# Patient Record
Sex: Male | Born: 1965 | Race: Black or African American | Hispanic: No | Marital: Married | Smoking: Never smoker
Health system: Southern US, Community
[De-identification: ages and names within clinical notes are randomized; demographics above are authoritative.]

## PROBLEM LIST (undated history)

## (undated) DIAGNOSIS — M25569 Pain in unspecified knee: Secondary | ICD-10-CM

## (undated) DIAGNOSIS — R209 Unspecified disturbances of skin sensation: Secondary | ICD-10-CM

## (undated) DIAGNOSIS — G4710 Hypersomnia, unspecified: Secondary | ICD-10-CM

## (undated) DIAGNOSIS — E7800 Pure hypercholesterolemia, unspecified: Secondary | ICD-10-CM

## (undated) DIAGNOSIS — E669 Obesity, unspecified: Secondary | ICD-10-CM

## (undated) DIAGNOSIS — Z833 Family history of diabetes mellitus: Secondary | ICD-10-CM

## (undated) HISTORY — DX: Pure hypercholesterolemia, unspecified: E78.00

## (undated) HISTORY — DX: Pain in unspecified knee: M25.569

## (undated) HISTORY — DX: Obesity, unspecified: E66.9

## (undated) HISTORY — DX: Family history of diabetes mellitus: Z83.3

## (undated) HISTORY — PX: HEMORRHOID SURGERY: SHX153

## (undated) HISTORY — DX: Unspecified disturbances of skin sensation: R20.9

## (undated) HISTORY — DX: Hypersomnia, unspecified: G47.10

---

## 2000-03-15 ENCOUNTER — Encounter: Admission: RE | Admit: 2000-03-15 | Discharge: 2000-03-15 | Payer: Self-pay | Admitting: Family Medicine

## 2000-03-15 ENCOUNTER — Encounter: Payer: Self-pay | Admitting: Family Medicine

## 2000-08-13 ENCOUNTER — Other Ambulatory Visit (HOSPITAL_COMMUNITY): Admission: RE | Admit: 2000-08-13 | Discharge: 2000-08-30 | Payer: Self-pay | Admitting: Psychiatry

## 2005-10-04 ENCOUNTER — Ambulatory Visit (HOSPITAL_COMMUNITY): Admission: RE | Admit: 2005-10-04 | Discharge: 2005-10-04 | Payer: Self-pay | Admitting: *Deleted

## 2008-10-01 ENCOUNTER — Encounter: Admission: RE | Admit: 2008-10-01 | Discharge: 2008-10-01 | Payer: Self-pay | Admitting: Internal Medicine

## 2008-10-16 ENCOUNTER — Encounter: Admission: RE | Admit: 2008-10-16 | Discharge: 2008-10-16 | Payer: Self-pay | Admitting: Internal Medicine

## 2008-12-26 ENCOUNTER — Emergency Department (HOSPITAL_COMMUNITY): Admission: EM | Admit: 2008-12-26 | Discharge: 2008-12-26 | Payer: Self-pay | Admitting: Family Medicine

## 2011-04-10 LAB — POCT RAPID STREP A (OFFICE): Streptococcus, Group A Screen (Direct): NEGATIVE

## 2011-05-12 NOTE — Op Note (Signed)
[name], ERRICO NO.:  [id number]   MEDICAL RECORD NO.:  [id number]          PATIENT TYPE:  AMB   LOCATION:  ENDO                         FACILITY:  [facility]   PHYSICIAN:  Georgiana Spinner, M.D.    DATE OF BIRTH:  July 20, 1966   DATE OF PROCEDURE:  10/04/2005  DATE OF DISCHARGE:                                 OPERATIVE REPORT   PROCEDURE:  Colonoscopy   INDICATIONS:  Rectal bleeding.   ANESTHESIA:  Demerol 60, Versed 7 mg.   DESCRIPTION OF PROCEDURE:  With the patient mildly sedated in the left  lateral decubitus position, we did a rectal examination and I could not  really feel the prostate well, but there were no external hemorrhoids.  Subsequently the Olympus videoscopic colonoscope was inserted in the rectum  and passed under direct vision to the cecum identified by ileocecal valve  and appendiceal orifice both of which were photographed. From this point,  the colonoscope was slowly withdrawn taking circumferential views of the  colonic mucosa stopping in the rectum which appeared normal on direct and  showed hemorrhoids on retroflexed view. The endoscope was straightened and  withdrawn. The patient's vital signs and pulse oximeter remained stable. The  patient tolerated the procedure well without apparent complications.   FINDINGS:  Internal hemorrhoids otherwise an unremarkable colonoscopic  examination to the cecum.   PLAN:  Have the patient follow-up with me on an as-needed basis.           ______________________________  Georgiana Spinner, M.D.     GMO/MEDQ  D:  10/04/2005  T:  10/04/2005  Job:  [id number]

## 2013-09-15 ENCOUNTER — Telehealth (HOSPITAL_COMMUNITY): Payer: Self-pay | Admitting: *Deleted

## 2013-09-19 ENCOUNTER — Telehealth (HOSPITAL_COMMUNITY): Payer: Self-pay | Admitting: *Deleted

## 2013-09-29 ENCOUNTER — Encounter (HOSPITAL_COMMUNITY): Payer: Self-pay | Admitting: *Deleted

## 2013-10-02 ENCOUNTER — Other Ambulatory Visit (HOSPITAL_COMMUNITY): Payer: Self-pay | Admitting: Internal Medicine

## 2013-10-02 DIAGNOSIS — Z8249 Family history of ischemic heart disease and other diseases of the circulatory system: Secondary | ICD-10-CM

## 2013-10-07 ENCOUNTER — Ambulatory Visit (HOSPITAL_COMMUNITY)
Admission: RE | Admit: 2013-10-07 | Discharge: 2013-10-07 | Disposition: A | Discharge status: Home / Self Care | Payer: Medicare HMO | Source: Ambulatory Visit | Attending: Cardiology | Admitting: Cardiology

## 2013-10-07 DIAGNOSIS — R079 Chest pain, unspecified: Secondary | ICD-10-CM

## 2013-10-07 DIAGNOSIS — Z8249 Family history of ischemic heart disease and other diseases of the circulatory system: Secondary | ICD-10-CM

## 2014-04-08 ENCOUNTER — Encounter: Payer: Self-pay | Admitting: Neurology

## 2014-04-09 ENCOUNTER — Ambulatory Visit: Payer: Medicare HMO | Admitting: Neurology

## 2014-10-26 ENCOUNTER — Ambulatory Visit: Payer: Medicare HMO | Admitting: Neurology

## 2014-10-28 ENCOUNTER — Encounter: Payer: Self-pay | Admitting: Neurology

## 2015-03-13 ENCOUNTER — Emergency Department (HOSPITAL_COMMUNITY)
Admission: EM | Admit: 2015-03-13 | Discharge: 2015-03-13 | Disposition: A | Discharge status: Home / Self Care | Payer: Managed Care, Other (non HMO) | Attending: Emergency Medicine | Admitting: Emergency Medicine

## 2015-03-13 ENCOUNTER — Encounter (HOSPITAL_COMMUNITY): Payer: Self-pay | Admitting: *Deleted

## 2015-03-13 DIAGNOSIS — F1410 Cocaine abuse, uncomplicated: Secondary | ICD-10-CM | POA: Diagnosis not present

## 2015-03-13 DIAGNOSIS — E780 Pure hypercholesterolemia: Secondary | ICD-10-CM | POA: Insufficient documentation

## 2015-03-13 DIAGNOSIS — F1010 Alcohol abuse, uncomplicated: Secondary | ICD-10-CM | POA: Diagnosis not present

## 2015-03-13 DIAGNOSIS — Z791 Long term (current) use of non-steroidal anti-inflammatories (NSAID): Secondary | ICD-10-CM | POA: Diagnosis not present

## 2015-03-13 DIAGNOSIS — Z79899 Other long term (current) drug therapy: Secondary | ICD-10-CM | POA: Insufficient documentation

## 2015-03-13 DIAGNOSIS — E669 Obesity, unspecified: Secondary | ICD-10-CM | POA: Diagnosis not present

## 2015-03-13 DIAGNOSIS — Z8669 Personal history of other diseases of the nervous system and sense organs: Secondary | ICD-10-CM | POA: Insufficient documentation

## 2015-03-13 DIAGNOSIS — F1910 Other psychoactive substance abuse, uncomplicated: Secondary | ICD-10-CM

## 2015-03-13 NOTE — ED Notes (Signed)
The pt is c/o wanting to be detoxed from crack cocaine and alcohol.  He last used both last pm.  He did not go to work today and he is afraid of loosing his job if he does not get help

## 2015-03-13 NOTE — Discharge Instructions (Signed)
Chemical Dependency Chemical dependency is an addiction to drugs or alcohol. It is characterized by the repeated behavior of seeking out and using drugs and alcohol despite harmful consequences to the health and safety of ones self and others.  RISK FACTORS There are certain situations or behaviors that increase a person's risk for chemical dependency. These include:  A family history of chemical dependency.  A history of mental health issues, including depression and anxiety.  A home environment where drugs and alcohol are easily available to you.  Drug or alcohol use at a young age. SYMPTOMS  The following symptoms can indicate chemical dependency:  Inability to limit the use of drugs or alcohol.  Nausea, sweating, shakiness, and anxiety that occurs when alcohol or drugs are not being used.  An increase in amount of drugs or alcohol that is necessary to get drunk or high. People who experience these symptoms can assess their use of drugs and alcohol by asking themselves the following questions:  Have you been told by friends or family that they are worried about your use of alcohol or drugs?  Do friends and family ever tell you about things you did while drinking alcohol or using drugs that you do not remember?  Do you lie about using alcohol or drugs or about the amounts you use?  Do you have difficulty completing daily tasks unless you use alcohol or drugs?  Is the level of your work or school performance lower because of your drug or alcohol use?  Do you get sick from using drugs or alcohol but keep using anyway?  Do you feel uncomfortable in social situations unless you use alcohol or drugs?  Do you use drugs or alcohol to help forget problems? An answer of yes to any of these questions may indicate chemical dependency. Professional evaluation is suggested. Document Released: 12/05/2001 Document Revised: 03/04/2012 Document Reviewed: 02/16/2011 [facility] Patient  Information 2015 [place], [place]. This information is not intended to replace advice given to you by your health care provider. Make sure you discuss any questions you have with your health care provider.  Alcohol and Nutrition Nutrition serves two purposes. It provides energy. It also maintains body structure and function. Food supplies energy. It also provides the building blocks needed to replace worn or damaged cells. Alcoholics often eat poorly. This limits their supply of essential nutrients. This affects energy supply and structure maintenance. Alcohol also affects the body's nutrients in:  Digestion.  Storage.  Using and getting rid of waste products. IMPAIRMENT OF NUTRIENT DIGESTION AND UTILIZATION   Once ingested, food must be broken down into small components (digested). Then it is available for energy. It helps maintain body structure and function. Digestion begins in the mouth. It continues in the stomach and intestines, with help from the pancreas. The nutrients from digested food are absorbed from the intestines into the blood. Then they are carried to the liver. The liver prepares nutrients for:  Immediate use.  Storage and future use.  Alcohol inhibits the breakdown of nutrients into usable molecules.  It decreases secretion of digestive enzymes from the pancreas.  Alcohol impairs nutrient absorption by damaging the cells lining the stomach and intestines.  It also interferes with moving some nutrients into the blood.  In addition, nutritional deficiencies themselves may lead to further absorption problems.  For example, folate deficiency changes the cells that line the small intestine. This impairs how water is absorbed. It also affects absorbed nutrients. These include glucose, sodium, and additional  folate.  Even if nutrients are digested and absorbed, alcohol can prevent them from being fully used. It changes their transport, storage, and excretion. Impaired  utilization of nutrients by alcoholics is indicated by:  Decreased liver stores of vitamins, such as vitamin A.  Increased excretion of nutrients such as fat. ALCOHOL AND ENERGY SUPPLY   Three basic nutritional components found in food are:  Carbohydrates.  Proteins.  Fats.  These are used as energy. Some alcoholics take in as much as 50% of their total daily calories from alcohol. They often neglect important foods.  Even when enough food is eaten, alcohol can impair the ways the body controls blood sugar (glucose) levels. It may either increase or decrease blood sugar.  In non-diabetic alcoholics, increased blood sugar (hyperglycemia) is caused by poor insulin secretion. It is usually temporary.  Decreased blood sugar (hypoglycemia) can cause serious injury even if this condition is short-lived. Low blood sugar can happen when a fasting or malnourished person drinks alcohol. When there is no food to supply energy, stored sugar is used up. The products of alcohol inhibit forming glucose from other compounds such as amino acids. As a result, alcohol causes the brain and other body tissue to lack glucose. It is needed for energy and function.  Alcohol is an energy source. But how the body processes and uses the energy from alcohol is complex. Also, when alcohol is substituted for carbohydrates, subjects tend to lose weight. This indicates that they get less energy from alcohol than from food. ALCOHOL - MAINTAINING CELL STRUCTURE AND FUNCTION  Structure Cells are made mostly of protein. So an adequate protein diet is important for maintaining cell structure. This is especially true if cells are being damaged. Research indicates that alcohol affects protein nutrition by causing impaired:  Digestion of proteins to amino acids.  Processing of amino acids by the small intestine and liver.  Synthesis of proteins from amino acids.  Protein secretion by the liver. Function Nutrients are  essential for the body to function well. They provide the tools that the body needs to work well:   Proteins.  Vitamins.  Minerals. Alcohol can disrupt body function. It may cause nutrient deficiencies. And it may interfere with the way nutrients are processed. Vitamins  Vitamins are essential to maintain growth and normal metabolism. They regulate many of the body`s processes. Chronic heavy drinking causes deficiencies in many vitamins. This is caused by eating less. And, in some cases, vitamins may be poorly absorbed. For example, alcohol inhibits fat absorption. It impairs how the vitamins A, E, and D are normally absorbed along with dietary fats. Not enough vitamin A may cause night blindness. Not enough vitamin D may cause softening of the bones.  Some alcoholics lack vitamins A, C, D, E, K, and the B vitamins. These are all involved in wound healing and cell maintenance. In particular, because vitamin K is necessary for blood clotting, lacking that vitamin can cause delayed clotting. The result is excess bleeding. Lacking other vitamins involved in brain function may cause severe neurological damage. Minerals Deficiencies of minerals such as calcium, magnesium, iron, and zinc are common in alcoholics. The alcohol itself does not seem to affect how these minerals are absorbed. Rather, they seem to occur secondary to other alcohol-related problems, such as:  Less calcium absorbed.  Not enough magnesium.  More urinary excretion.  Vomiting.  Diarrhea.  Not enough iron due to gastrointestinal bleeding.  Not enough zinc or losses related to other nutrient  deficiencies.  Mineral deficiencies can cause a variety of medical consequences. These range from calcium-related bone disease to zinc-related night blindness and skin lesions. ALCOHOL, MALNUTRITION, AND MEDICAL COMPLICATIONS  Liver Disease   Alcoholic liver damage is caused primarily by alcohol itself. But poor nutrition may  increase the risk of alcohol-related liver damage. For example, nutrients normally found in the liver are known to be affected by drinking alcohol. These include carotenoids, which are the major sources of vitamin A, and vitamin E compounds. Decreases in such nutrients may play some role in alcohol-related liver damage. Pancreatitis  Research suggests that malnutrition may increase the risk of developing alcoholic pancreatitis. Research suggests that a diet lacking in protein may increase alcohol's damaging effect on the pancreas. Brain  Nutritional deficiencies may have severe effects on brain function. These may be permanent. Specifically, thiamine deficiencies are often seen in alcoholics. They can cause severe neurological problems. These include:  Impaired movement.  Memory loss seen in Wernicke-Korsakoff syndrome. Pregnancy  Alcohol has toxic effects on fetal development. It causes alcohol-related birth defects. They include fetal alcohol syndrome. Alcohol itself is toxic to the fetus. Also, the nutritional deficiency can affect how the fetus develops. That may compound the risk of developmental damage.  Nutritional needs during pregnancy are 10% to 30% greater than normal. Food intake can increase by as much as 140% to cover the needs of both mother and fetus. An alcoholic mother`s nutritional problems may adversely affect the nutrition of the fetus. And alcohol itself can also restrict nutrition flow to the fetus. NUTRITIONAL STATUS OF ALCOHOLICS  Techniques for assessing nutritional status include:  Taking body measurements to estimate fat reserves. They include:  Weight.  Height.  Mass.  Skin fold thickness.  Performing blood analysis to provide measurements of circulating:  Proteins.  Vitamins.  Minerals.  These techniques tend to be imprecise. For many nutrients, there is no clear "cut-off" point that would allow an accurate definition of deficiency. So assessing the  nutritional status of alcoholics is limited by these techniques. Dietary status may provide information about the risk of developing nutritional problems. Dietary status is assessed by:  Taking patients' dietary histories.  Evaluating the amount and types of food they are eating.  It is difficult to determine what exact amount of alcohol begins to have damaging effects on nutrition. In general, moderate drinkers have 2 drinks or less per day. They seem to be at little risk for nutritional problems. Various medical disorders begin to appear at greater levels.  Research indicates that the majority of even the heaviest drinkers have few obvious nutritional deficiencies. Many alcoholics who are hospitalized for medical complications of their disease do have severe malnutrition. Alcoholics tend to eat poorly. Often they eat less than the amounts of food necessary to provide enough:  Carbohydrates.  Protein.  Fat.  Vitamins A and C.  B vitamins.  Minerals like calcium and iron. Of major concern is alcohol's effect on digesting food and use of nutrients. It may shift a mildly malnourished person toward severe malnutrition. Document Released: 10/05/2005 Document Revised: 03/04/2012 Document Reviewed: 03/21/2006 [facility] Patient Information 2015 [place], [place]. This information is not intended to replace advice given to you by your health care provider. Make sure you discuss any questions you have with your health care provider.

## 2015-03-13 NOTE — ED Provider Notes (Signed)
CSN: [id number]     Arrival date & time 03/13/15  1745 History   First MD Initiated Contact with Patient 03/13/15 1803     Chief Complaint  Patient presents with  . detox      (Consider location/radiation/quality/duration/timing/severity/associated sxs/prior Treatment) HPI   PCP: Anthony Leblanc,WALTER DAVIDSON, MD Blood pressure 117/70, pulse 87, temperature 99 F (37.2 C), temperature source Oral, resp. rate 18, SpO2 97 %.  Anthony Leblanc is a 49 y.o.male without any significant PMH presents to the ER requesting detox from cocaine and alcohol. He reports drinking since 1983, he binge drinks on the weekends and is able to stay sober during the week for work. He only uses cocaine after he has been drinking. He denies getting tremor or ever having seizures from alcohol withdrawals. The patient admits to drinking and using cocaine on Thursday night and missing work yesterday and today and is afraid he will loose his job unless he gets help. He reports having a wife and two kids are very supportive and helpful to his efforts to quite. He prefers to do outpatient treatment than inpatient treatment. He denies abuse of any other substances. Denies SI/HI. Denies hallucinations, delusions or paranoia.   Past Medical History  Diagnosis Date  . Pure hypercholesterolemia   . Obesity, unspecified   . Family history of diabetes mellitus   . Pain in joint, lower leg     Patella-femoral syndrome  . Disturbance of skin sensation   . Hypersomnia, unspecified    Past Surgical History  Procedure Laterality Date  . Hemorrhoid surgery     Family History  Problem Relation Age of Onset  . Cancer Father   . Diabetes Mother    History  Substance Use Topics  . Smoking status: Never Smoker   . Smokeless tobacco: Not on file  . Alcohol Use: 2.0 oz/week    4 drink(s) per week    Review of Systems  10 Systems reviewed and are negative for acute change except as noted in the HPI.     Allergies  Review of  patient's allergies indicates no known allergies.  Home Medications   Prior to Admission medications   Medication Sig Start Date End Date Taking? Authorizing Provider  atorvastatin (LIPITOR) 40 MG tablet Take 40 mg by mouth daily.   Yes Historical Provider, MD  celecoxib (CELEBREX) 200 MG capsule Take 200 mg by mouth daily. with food   Yes Historical Provider, MD  Multiple Vitamin (MULTIVITAMIN) tablet Take 1 tablet by mouth daily.   Yes Historical Provider, MD  omega-3 acid ethyl esters (LOVAZA) 1 G capsule Take 4 capsules by mouth daily.   Yes Historical Provider, MD   BP 117/70 mmHg  Pulse 87  Temp(Src) 99 F (37.2 C) (Oral)  Resp 18  SpO2 97% Physical Exam  Constitutional: He appears well-developed and well-nourished. No distress.  HENT:  Head: Normocephalic and atraumatic.  Eyes: Pupils are equal, round, and reactive to light.  Neck: Normal range of motion. Neck supple.  Cardiovascular: Normal rate and regular rhythm.   Pulmonary/Chest: Effort normal.  Abdominal: Soft.  Neurological: He is alert.  Skin: Skin is warm and dry.  Psychiatric: His speech is normal and behavior is normal. His mood appears not anxious. He is not actively hallucinating. Thought content is not delusional. He does not exhibit a depressed mood. He expresses no homicidal and no suicidal ideation.  Nursing note and vitals reviewed.   ED Course  Procedures (including critical care time) Labs  Review Labs Reviewed - No data to display  Imaging Review No results found.   EKG Interpretation None      MDM   Final diagnoses:  Polysubstance abuse    Patient binge drinks on alcohol and uses cocaine. He denies having tremors or seizures when no alcohol is in his system and reports being able to go for weeks without drinking when he is working out. He denies wanting inpatient treatment and has been through the [facility], feels he needs to be more present and apply himself, also potentially find  and AA sponsor. Denies SI/HI. I will give outpatient resources as well as clear return to ER guidelines.  49 y.o.Anthony Leblanc evaluation in the Emergency Department is complete. It has been determined that no acute conditions requiring further emergency intervention are present at this time. The patient/guardian have been advised of the diagnosis and plan. We have discussed signs and symptoms that warrant return to the ED, such as changes or worsening in symptoms.  Vital signs are stable at discharge. Filed Vitals:   03/13/15 1900  BP: 117/70  Pulse: 78  Temp:   Resp:     Patient/guardian has voiced understanding and agreed to follow-up with the PCP or specialist.    [name], PA-C 03/13/15 1911  Rolland Porter, MD 03/13/15 2212

## 2016-08-17 ENCOUNTER — Emergency Department (HOSPITAL_COMMUNITY)
Admission: EM | Admit: 2016-08-17 | Discharge: 2016-08-17 | Disposition: A | Discharge status: Home / Self Care | Payer: Managed Care, Other (non HMO) | Attending: Emergency Medicine | Admitting: Emergency Medicine

## 2016-08-17 ENCOUNTER — Encounter (HOSPITAL_COMMUNITY): Payer: Self-pay | Admitting: Emergency Medicine

## 2016-08-17 DIAGNOSIS — F1910 Other psychoactive substance abuse, uncomplicated: Secondary | ICD-10-CM

## 2016-08-17 DIAGNOSIS — R600 Localized edema: Secondary | ICD-10-CM | POA: Insufficient documentation

## 2016-08-17 DIAGNOSIS — Z79899 Other long term (current) drug therapy: Secondary | ICD-10-CM | POA: Insufficient documentation

## 2016-08-17 DIAGNOSIS — F1410 Cocaine abuse, uncomplicated: Secondary | ICD-10-CM | POA: Insufficient documentation

## 2016-08-17 LAB — CBC
HEMATOCRIT: 44.3 % (ref 39.0–52.0)
Hemoglobin: 14.4 g/dL (ref 13.0–17.0)
MCH: 28.1 pg (ref 26.0–34.0)
MCHC: 32.5 g/dL (ref 30.0–36.0)
MCV: 86.4 fL (ref 78.0–100.0)
PLATELETS: 271 10*3/uL (ref 150–400)
RBC: 5.13 MIL/uL (ref 4.22–5.81)
RDW: 13.5 % (ref 11.5–15.5)
WBC: 8.3 10*3/uL (ref 4.0–10.5)

## 2016-08-17 LAB — COMPREHENSIVE METABOLIC PANEL
ALT: 27 U/L (ref 17–63)
ANION GAP: 9 (ref 5–15)
AST: 27 U/L (ref 15–41)
Albumin: 4.3 g/dL (ref 3.5–5.0)
Alkaline Phosphatase: 80 U/L (ref 38–126)
BUN: 8 mg/dL (ref 6–20)
CHLORIDE: 105 mmol/L (ref 101–111)
CO2: 26 mmol/L (ref 22–32)
Calcium: 10 mg/dL (ref 8.9–10.3)
Creatinine, Ser: 1.22 mg/dL (ref 0.61–1.24)
GFR calc non Af Amer: >60 mL/min (ref >60)
Glucose, Bld: 136 mg/dL — ABNORMAL HIGH (ref 65–99)
Potassium: 4.2 mmol/L (ref 3.5–5.1)
SODIUM: 140 mmol/L (ref 135–145)
Total Bilirubin: 0.5 mg/dL (ref 0.3–1.2)
Total Protein: 8 g/dL (ref 6.5–8.1)

## 2016-08-17 LAB — RAPID URINE DRUG SCREEN, HOSP PERFORMED
AMPHETAMINES: NOT DETECTED
BARBITURATES: NOT DETECTED
BENZODIAZEPINES: NOT DETECTED
Cocaine: POSITIVE — AB
Opiates: NOT DETECTED
Tetrahydrocannabinol: NOT DETECTED

## 2016-08-17 LAB — ETHANOL: Alcohol, Ethyl (B): <5 mg/dL (ref <5)

## 2016-08-17 MED ORDER — ONDANSETRON HCL 4 MG/2ML IJ SOLN: 4.0000 mg | Freq: Once | INTRAMUSCULAR | Status: DC

## 2016-08-17 MED ORDER — SODIUM CHLORIDE 0.9 % IV BOLUS (SEPSIS)
1000.0000 mL | Freq: Once | INTRAVENOUS | Status: AC
  Administered 2016-08-17: 1000 mL via INTRAVENOUS

## 2016-08-17 NOTE — ED Triage Notes (Signed)
Patient states that he would like detox from crack.  Patient states that the last time he used was before coming to the ED.  Patient does have ETOH on board.

## 2016-08-17 NOTE — ED Provider Notes (Signed)
[facility]-EMERGENCY DEPT Provider Note   CSN: [id number] Arrival date & time: 08/17/16  0359     History   Chief Complaint Chief Complaint  Patient presents with  . Medical Clearance    HPI Anthony Leblanc is a 50 y.o. malePMH of alcohol and cocaine abuse, here with the same.  Patient states he last used around 11pm.  He wants help to quit.  He denies feeling like he is withdrawing currently.  He denies any SI/HI or hallucinations.  He denies medical complaints such as fever, resp complaints, vomiting or diarrhea.  He does have mild nausea.  There are no further complaints.  10 Systems reviewed and are negative for acute change except as noted in the HPI.   HPI  Past Medical History:  Diagnosis Date  . Disturbance of skin sensation   . Family history of diabetes mellitus   . Hypersomnia, unspecified   . Obesity, unspecified   . Pain in joint, lower leg    Patella-femoral syndrome  . Pure hypercholesterolemia     There are no active problems to display for this patient.   Past Surgical History:  Procedure Laterality Date  . HEMORRHOID SURGERY         Home Medications    Prior to Admission medications   Medication Sig Start Date End Date Taking? Authorizing Provider  Multiple Vitamin (MULTIVITAMIN) tablet Take 1 tablet by mouth daily.   Yes Historical Provider, MD  omega-3 acid ethyl esters (LOVAZA) 1 G capsule Take 4 capsules by mouth daily.   Yes Historical Provider, MD    Family History Family History  Problem Relation Age of Onset  . Cancer Father   . Diabetes Mother     Social History Social History  Substance Use Topics  . Smoking status: Never Smoker  . Smokeless tobacco: Never Used  . Alcohol use 2.0 oz/week    4 Standard drinks or equivalent per week     Allergies   Review of patient's allergies indicates no known allergies.   Review of Systems Review of Systems   Physical Exam Updated Vital Signs BP 143/93   Pulse 108   Temp 98.4 F  (36.9 C) (Oral)   Resp 15   Ht 6\' 2"  (1.88 m)   Wt 265 lb (120.2 kg)   SpO2 97%   BMI 34.02 kg/m   Physical Exam  Constitutional: He is oriented to person, place, and time. Vital signs are normal. He appears well-developed and well-nourished.  Non-toxic appearance. He does not appear ill. No distress.  HENT:  Head: Normocephalic and atraumatic.  Nose: Nose normal.  Mouth/Throat: Oropharynx is clear and moist. No oropharyngeal exudate.  Eyes: Conjunctivae and EOM are normal. Pupils are equal, round, and reactive to light. No scleral icterus.  Neck: Normal range of motion. Neck supple. No tracheal deviation, no edema, no erythema and normal range of motion present. No thyroid mass and no thyromegaly present.  Cardiovascular: Normal rate, regular rhythm, S1 normal, S2 normal, normal heart sounds, intact distal pulses and normal pulses.  Exam reveals no gallop and no friction rub.   No murmur heard. Pulmonary/Chest: Effort normal and breath sounds normal. No respiratory distress. He has no wheezes. He has no rhonchi. He has no rales.  Abdominal: Soft. Normal appearance and bowel sounds are normal. He exhibits no distension, no ascites and no mass. There is no hepatosplenomegaly. There is no tenderness. There is no rebound, no guarding and no CVA tenderness.  Musculoskeletal: Normal range  of motion. He exhibits edema. He exhibits no tenderness.  Lymphadenopathy:    He has no cervical adenopathy.  Neurological: He is alert and oriented to person, place, and time. He has normal strength. No cranial nerve deficit or sensory deficit.  Skin: Skin is warm, dry and intact. No petechiae and no rash noted. He is not diaphoretic. No erythema. No pallor.  Nursing note and vitals reviewed.    ED Treatments / Results  Labs (all labs ordered are listed, but only abnormal results are displayed) Labs Reviewed  URINE RAPID DRUG SCREEN, HOSP PERFORMED - Abnormal; Notable for the following:       Result  Value   Cocaine POSITIVE (*)    All other components within normal limits  COMPREHENSIVE METABOLIC PANEL - Abnormal; Notable for the following:    Glucose, Bld 136 (*)    All other components within normal limits  ETHANOL  CBC    EKG  EKG Interpretation  Date/Time:  Thursday August 17 2016 04:54:39 EDT Ventricular Rate:  100 PR Interval:    QRS Duration: 94 QT Interval:  354 QTC Calculation: 457 R Axis:   29 Text Interpretation:  Sinus tachycardia Abnormal R-wave progression, early transition No old tracing to compare Confirmed by Erroll Lunani, Masiyah Jorstad Ayokunle [phone]([id number]) on 08/17/2016 5:11:46 AM       Radiology No results found.  Procedures Procedures (including critical care time)  Medications Ordered in ED Medications  ondansetron (ZOFRAN) injection 4 mg (0 mg Intravenous Hold 08/17/16 0531)  sodium chloride 0.9 % bolus 1,000 mL (1,000 mLs Intravenous New Bag/Given 08/17/16 0531)     Initial Impression / Assessment and Plan / ED Course  I have reviewed the triage vital signs and the nursing notes.  Pertinent labs & imaging results that were available during my care of the patient were reviewed by me and considered in my medical decision making (see chart for details).  Clinical Course    Patient presents to the ED for detox.  He was informed we no longer consult TTS for detox.  He was given IVF for tachycardia and zofran for nausea, although this is likely related to cocaine use.  He was given resources to call upon DC.  Labs and UA pending.   5:47 AM labs unremarkable.  UDS pos for cocaine.  Resources given.  HR now down to 80s.  He appears well and in NAD.  Patient safe for Dc. Final Clinical Impressions(s) / ED Diagnoses   Final diagnoses:  None    New Prescriptions New Prescriptions   No medications on file     Tomasita CrumbleAdeleke Haely Leyland, MD 08/17/16 [phone]

## 2018-05-05 ENCOUNTER — Emergency Department (HOSPITAL_COMMUNITY)
Admission: EM | Admit: 2018-05-05 | Discharge: 2018-05-05 | Disposition: A | Discharge status: Home / Self Care | Payer: Managed Care, Other (non HMO) | Attending: Emergency Medicine | Admitting: Emergency Medicine

## 2018-05-05 ENCOUNTER — Other Ambulatory Visit: Payer: Self-pay

## 2018-05-05 ENCOUNTER — Emergency Department (HOSPITAL_COMMUNITY): Payer: Managed Care, Other (non HMO)

## 2018-05-05 DIAGNOSIS — W19XXXA Unspecified fall, initial encounter: Secondary | ICD-10-CM

## 2018-05-05 DIAGNOSIS — M25561 Pain in right knee: Secondary | ICD-10-CM

## 2018-05-05 DIAGNOSIS — S39012A Strain of muscle, fascia and tendon of lower back, initial encounter: Secondary | ICD-10-CM | POA: Insufficient documentation

## 2018-05-05 DIAGNOSIS — W1789XA Other fall from one level to another, initial encounter: Secondary | ICD-10-CM | POA: Insufficient documentation

## 2018-05-05 DIAGNOSIS — Y939 Activity, unspecified: Secondary | ICD-10-CM | POA: Insufficient documentation

## 2018-05-05 DIAGNOSIS — Y999 Unspecified external cause status: Secondary | ICD-10-CM | POA: Insufficient documentation

## 2018-05-05 DIAGNOSIS — Y929 Unspecified place or not applicable: Secondary | ICD-10-CM | POA: Insufficient documentation

## 2018-05-05 MED ORDER — DICLOFENAC SODIUM 50 MG PO TBEC: 50.0000 mg | DELAYED_RELEASE_TABLET | Freq: Two times a day (BID) | ORAL | 0 refills | Status: DC

## 2018-05-05 MED ORDER — CYCLOBENZAPRINE HCL 10 MG PO TABS: 10.0000 mg | ORAL_TABLET | Freq: Every evening | ORAL | 0 refills | Status: DC | PRN

## 2018-05-05 NOTE — ED Notes (Signed)
Bed: WTR6 Expected date:  Expected time:  Means of arrival:  Comments: 

## 2018-05-05 NOTE — ED Provider Notes (Signed)
[facility]-EMERGENCY DEPT Provider Note   CSN: [id number] Arrival date & time: 05/05/18  1706     History   Chief Complaint Chief Complaint  Patient presents with  . Knee Pain  . Back Pain  . Fall    HPI Anthony Leblanc is a 52 y.o. male who presents to the ED with knee pain and low back pain after jumping down off a truck with a bag of mulch and fell. The injury happened 3 days ago. Patient has been taking ibuprofen and it is helping. Patient missed work due to the injury and need a return note. Patient denies head injury or LOC. Patient is not on blood thinners.  HPI  Past Medical History:  Diagnosis Date  . Disturbance of skin sensation   . Family history of diabetes mellitus   . Hypersomnia, unspecified   . Obesity, unspecified   . Pain in joint, lower leg    Patella-femoral syndrome  . Pure hypercholesterolemia     There are no active problems to display for this patient.   Past Surgical History:  Procedure Laterality Date  . HEMORRHOID SURGERY          Home Medications    Prior to Admission medications   Medication Sig Start Date End Date Taking? Authorizing Provider  cyclobenzaprine (FLEXERIL) 10 MG tablet Take 1 tablet (10 mg total) by mouth at bedtime as needed for muscle spasms. 05/05/18   Janne Napoleon, NP  diclofenac (VOLTAREN) 50 MG EC tablet Take 1 tablet (50 mg total) by mouth 2 (two) times daily. 05/05/18   Janne Napoleon, NP  Multiple Vitamin (MULTIVITAMIN) tablet Take 1 tablet by mouth daily.    [provider]  omega-3 acid ethyl esters (LOVAZA) 1 G capsule Take 4 capsules by mouth daily.    [provider]    Family History Family History  Problem Relation Age of Onset  . Cancer Father   . Diabetes Mother     Social History Social History   Tobacco Use  . Smoking status: Never Smoker  . Smokeless tobacco: Never Used  Substance Use Topics  . Alcohol use: Yes    Alcohol/week: 2.0 oz    Types: 4  Standard drinks or equivalent per week  . Drug use: Yes    Types: Cocaine     Allergies   Patient has no known allergies.   Review of Systems Review of Systems  Musculoskeletal: Positive for arthralgias and back pain.  All other systems reviewed and are negative.    Physical Exam Updated Vital Signs BP 116/79 (BP Location: Right Arm)   Pulse 82   Temp 98.3 F (36.8 C) (Oral)   Resp 18   Ht  (1.88 m)   Wt 115.7 kg (255 lb)   SpO2 100%   BMI 32.74 kg/m   Physical Exam  Constitutional: He appears well-developed and well-nourished. No distress.  HENT:  Head: Normocephalic and atraumatic.  Eyes: EOM are normal.  Neck: Normal range of motion. Neck supple.  Cardiovascular: Normal rate.  Pulmonary/Chest: Effort normal.  Musculoskeletal:       Right knee: He exhibits laceration. He exhibits normal range of motion, no swelling, no deformity and normal alignment. Tenderness found.       Lumbar back: He exhibits tenderness and spasm.       Legs: Grips are equal  Neurological: He is alert. He has normal strength. No sensory deficit. Gait normal.  Reflex Scores:  Bicep reflexes are 2+ on the right side and 2+ on the left side.      Brachioradialis reflexes are 2+ on the right side and 2+ on the left side.      Patellar reflexes are 2+ on the right side and 2+ on the left side. Skin: Skin is warm and dry.  Psychiatric: He has a normal mood and affect.  Nursing note and vitals reviewed.    ED Treatments / Results  Labs (all labs ordered are listed, but only abnormal results are displayed) Labs Reviewed - No data to display  Radiology Dg Knee Complete 4 Views Right  Result Date: 05/05/2018 CLINICAL DATA:  Right anterior knee pain EXAM: RIGHT KNEE - COMPLETE 4+ VIEW COMPARISON:  None. FINDINGS: No acute displaced fracture or malalignment. No significant knee effusion. Mild degenerative changes of the patellofemoral medial compartments. IMPRESSION: No definite  acute osseous abnormality.  Mild degenerative changes. Electronically Signed   By: Jasmine Pang M.D.   On: 05/05/2018 18:31    Procedures Procedures (including critical care time)  Medications Ordered in ED Medications - No data to display   Initial Impression / Assessment and Plan / ED Course  I have reviewed the triage vital signs and the nursing notes. 52 y.o. male with right knee and low back pain s/p fall 3 days ago that has been improving with ibuprofen stable for d/c without neuro deficits. Will treat for pain and inflammation. Knee sleeve applied, ice elevation and f/u with PCP.   Final Clinical Impressions(s) / ED Diagnoses   Final diagnoses:  Fall, initial encounter  Acute pain of right knee  Strain of lumbar region, initial encounter    ED Discharge Orders        Ordered    cyclobenzaprine (FLEXERIL) 10 MG tablet  At bedtime PRN     05/05/18 1859    diclofenac (VOLTAREN) 50 MG EC tablet  2 times daily     05/05/18 1859       Janne Napoleon, NP 05/05/18 1902    Nira Conn, MD 05/06/18 [phone]

## 2018-05-05 NOTE — ED Triage Notes (Addendum)
Pt to ED by POV with complaints of jumping down off truck with a bag of mulch and fell. Pt c/o of knee pain and lower back pain. Pt denies hitting his head, but has small anterior hematoma on the back of the head that he states "doesn't know what its from" Pt is not on any blood thinners. Pt state his knee pain is 5/10 and back 6/10. Pt is alert and orient x4 and fully ambulatory. Pt denies any dizziness, blurry vision, or headaches

## 2018-05-14 ENCOUNTER — Ambulatory Visit (HOSPITAL_COMMUNITY)
Admission: EM | Admit: 2018-05-14 | Discharge: 2018-05-14 | Disposition: A | Discharge status: Home / Self Care | Payer: Self-pay | Attending: Family Medicine | Admitting: Family Medicine

## 2018-05-14 ENCOUNTER — Encounter (HOSPITAL_COMMUNITY): Payer: Self-pay | Admitting: Family Medicine

## 2018-05-14 ENCOUNTER — Other Ambulatory Visit: Payer: Self-pay

## 2018-05-14 DIAGNOSIS — B9789 Other viral agents as the cause of diseases classified elsewhere: Secondary | ICD-10-CM

## 2018-05-14 DIAGNOSIS — R062 Wheezing: Secondary | ICD-10-CM

## 2018-05-14 DIAGNOSIS — J069 Acute upper respiratory infection, unspecified: Secondary | ICD-10-CM

## 2018-05-14 MED ORDER — PREDNISONE 10 MG (21) PO TBPK: ORAL_TABLET | Freq: Every day | ORAL | 0 refills | Status: DC

## 2018-05-14 NOTE — Discharge Instructions (Signed)
Follow up with your primary care doctor or here if you are not seeing improvement of your symptoms over the next several days, sooner if you feel you are worsening.  Caring for yourself: Get plenty of rest. Drink plenty of fluids, enough so that your urine is light yellow or clear like water. If you have kidney, heart, or liver disease and have to limit fluids, talk with your doctor before you increase the amount of fluids you drink. Take an over-the-counter pain medicine if needed, such as acetaminophen (Tylenol), ibuprofen (Advil, Motrin), or naproxen (Aleve), to relieve fever, headache, and muscle aches. Read and follow all instructions on the label. No one younger than 20 should take aspirin. It has been linked to Reye syndrome, a serious illness. Before you use over the counter cough and cold medicines, check the label. These medicines may not be safe for children younger than age 6 or for people with certain health problems. If the skin around your nose and lips becomes sore, put some petroleum jelly on the area.  Avoid spreading a virus: Wash your hands regularly, and keep your hands away from your face.  Stay home from school, work, and other public places until you are feeling better and your fever has been gone for at least 24 hours. The fever needs to have gone away on its own without the help of medicine.  

## 2018-05-14 NOTE — ED Triage Notes (Signed)
Pt reports starting to feel bad last Wednesday.  He reports his throat being scratchy, post nasal drip, cough, congestion, and some diarrhea.  Pt states his job will not let him back until he has a note to clear him to go back to work.

## 2018-05-14 NOTE — ED Provider Notes (Signed)
[facility]   [id number] 05/14/18 Arrival Time: 1000  ASSESSMENT & PLAN:  1. Viral URI with cough   2. Wheezing    Meds ordered this encounter  Medications  . predniSONE (STERAPRED UNI-PAK 21 TAB) 10 MG (21) TBPK tablet    Sig: Take by mouth daily. Take as directed.    Dispense:  21 tablet    Refill:  0   Discussed typical duration of symptoms. Question component of seasonal allergies. OTC anti-histamine may help. OTC symptom care as needed. Ensure adequate fluid intake and rest. May f/u with PCP or here as needed.  Reviewed expectations re: course of current medical issues. Questions answered. Outlined signs and symptoms indicating need for more acute intervention. Patient verbalized understanding. After Visit Summary given.   SUBJECTIVE: History from: patient.  Anthony Leblanc is a 52 y.o. male who presents with complaint of nasal congestion, post-nasal drainage, and a persistent dry cough. Onset abrupt, approximately 5 days ago. Overall fatigued with mild body aches. Occasional loose stools. SOB: none. Wheezing: at times, esp with coughing. Fever: no. Overall normal PO intake without n/v. OTC treatment: none. Daughter now with similar symptoms.  Social History   Tobacco Use  Smoking Status Never Smoker  Smokeless Tobacco Never Used   ROS: As per HPI.   OBJECTIVE:  Vitals:   05/14/18 1025  BP: 123/85  Pulse: 79  Temp: 98.2 F (36.8 C)  TempSrc: Oral  SpO2: 98%    General appearance: alert; appears fatigued HEENT: nasal congestion; clear runny nose; throat irritation secondary to post-nasal drainage Neck: supple without LAD Lungs: unlabored respirations, symmetrical air entry with mild expiratory wheezes; cough: mild; no respiratory distress Skin: warm and dry Psychological: alert and cooperative; normal mood and affect   No Known Allergies  Past Medical History:  Diagnosis Date  . Disturbance of skin sensation   . Family history of  diabetes mellitus   . Hypersomnia, unspecified   . Obesity, unspecified   . Pain in joint, lower leg    Patella-femoral syndrome  . Pure hypercholesterolemia    Family History  Problem Relation Age of Onset  . Cancer Father   . Diabetes Mother    Social History   Socioeconomic History  . Marital status: Married    Spouse name: Not on file  . Number of children: Not on file  . Years of education: Not on file  . Highest education level: Not on file  Occupational History  . Not on file  Social Needs  . Financial resource strain: Not on file  . Food insecurity:    Worry: Not on file    Inability: Not on file  . Transportation needs:    Medical: Not on file    Non-medical: Not on file  Tobacco Use  . Smoking status: Never Smoker  . Smokeless tobacco: Never Used  Substance and Sexual Activity  . Alcohol use: Yes    Alcohol/week: 2.0 oz    Types: 4 Standard drinks or equivalent per week  . Drug use: Yes    Types: Cocaine  . Sexual activity: Not on file  Lifestyle  . Physical activity:    Days per week: Not on file    Minutes per session: Not on file  . Stress: Not on file  Relationships  . Social connections:    Talks on phone: Not on file    Gets together: Not on file    Attends religious service: Not on file  Active member of club or organization: Not on file    Attends meetings of clubs or organizations: Not on file    Relationship status: Not on file  . Intimate partner violence:    Fear of current or ex partner: Not on file    Emotionally abused: Not on file    Physically abused: Not on file    Forced sexual activity: Not on file  Other Topics Concern  . Not on file  Social History Narrative  . Not on file           Mardella Layman, MD 05/14/18 1055

## 2018-12-12 ENCOUNTER — Other Ambulatory Visit: Payer: Self-pay | Admitting: Sports Medicine

## 2018-12-12 DIAGNOSIS — M898X1 Other specified disorders of bone, shoulder: Secondary | ICD-10-CM

## 2018-12-22 ENCOUNTER — Ambulatory Visit
Admission: RE | Admit: 2018-12-22 | Discharge: 2018-12-22 | Disposition: A | Discharge status: Home / Self Care | Payer: 59 | Source: Ambulatory Visit | Attending: Sports Medicine | Admitting: Sports Medicine

## 2018-12-22 DIAGNOSIS — M898X1 Other specified disorders of bone, shoulder: Secondary | ICD-10-CM

## 2020-04-29 ENCOUNTER — Ambulatory Visit (HOSPITAL_COMMUNITY): Payer: Self-pay | Admitting: Surgery

## 2020-04-29 NOTE — H&P (Signed)
Anthony Leblanc Appointment: 04/29/2020 11:00 AM Location: [facility] Patient #: [id number] DOB: 01/11/1966 Married / Language: English / Race: Black or African American Male  History of Present Illness Ardeth Sportsman MD; 04/29/2020 11:38 AM) The patient is a 54 year old male who presents with inguinal swelling. Note for "Inguinal swelling": ` ` ` Patient sent for surgical consultation at the request of Cari Caraway, NP  Chief Complaint: Groin pain and swelling. ` ` The patient is an active male who noted some groin pain and discomfort. Thinks he's had it past 20 years. Not very bothersome but then had episode of sharp pain with heavy lifting at work. Concerned him. He went to his primary office. Inguinal hernia suspected. Surgical consultation offered. Faded in the next week. Patient wonders more if it was an inflamed cyst. No drainage or pus. No fevers chills or sweats. No change in his bowels. No nausea or vomiting. 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 History of moderate tobacco intake but claims he quit 3 weeks ago. Claims to have rather intense physical activity requirements at work and can walk several miles without difficulty.   No personal nor family history of GI/colon cancer, inflammatory bowel disease, irritable bowel syndrome, allergy such as Celiac Sprue, dietary/dairy problems, colitis, ulcers nor gastritis. No recent sick contacts/gastroenteritis. No travel outside the country. No changes in diet. No dysphagia to solids or liquids. No significant heartburn or reflux. No hematochezia, hematemesis, coffee ground emesis. No evidence of prior gastric/peptic ulceration. History colonoscopy. Last 1 2019 on [place]. Sounds like Dr. Elnoria Howard. He does not recall any major abnormalities.  (Review of systems as stated in this history (HPI) or in the review of systems. Otherwise all other 12 point ROS are  negative) ` ` `  This patient encounter took 25 minutes today to perform the following: obtain history, perform exam, review outside records, interpret tests & imaging, counsel the patient on their diagnosis; and, document this encounter, including findings & plan in the electronic health record (EHR).   Past Surgical History Adela Lank [place], RMA; 04/29/2020 11:00 AM) Anal Fissure Repair  Diagnostic Studies History [facility], RMA; 04/29/2020 11:00 AM) Colonoscopy 1-5 years ago  Allergies Adela Lank Haggett, RMA; 04/29/2020 11:00 AM) No Known Drug Allergies [04/29/2020]: Allergies Reconciled  Medication History [facility], RMA; 04/29/2020 11:01 AM) Lipitor (20MG  Tablet, Oral) Active. Meloxicam (Oral) Specific strength unknown - Active. Fish Oil (Oral) Specific strength unknown - Active. Multi Vitamin (Oral) Active. Medications Reconciled  Social History [place], RMA; 04/29/2020 11:00 AM) Alcohol use Occasional alcohol use. Caffeine use Carbonated beverages, Coffee. Tobacco use Former smoker.  Family History 06/29/2020 [place], RMA; 04/29/2020 11:00 AM) Alcohol Abuse Brother.  Other Problems 06/29/2020 [place], RMA; 04/29/2020 11:00 AM) Hypercholesterolemia Umbilical Hernia Repair     Review of Systems (Jacqueline Haggett RMA; 04/29/2020 11:00 AM) General Not Present- Appetite Loss, Chills, Fatigue, Fever, Night Sweats, Weight Gain and Weight Loss. Skin Not Present- Change in Wart/Mole, Dryness, Hives, Jaundice, New Lesions, Non-Healing Wounds, Rash and Ulcer. HEENT Present- Wears glasses/contact lenses. Not Present- Earache, Hearing Loss, Hoarseness, Nose Bleed, Oral Ulcers, Ringing in the Ears, Seasonal Allergies, Sinus Pain, Sore Throat, Visual Disturbances and Yellow Eyes. Respiratory Not Present- Bloody sputum, Chronic Cough, Difficulty Breathing, Snoring and Wheezing. Cardiovascular Not Present- Chest Pain, Difficulty Breathing  Lying Down, Leg Cramps, Palpitations, Rapid Heart Rate, Shortness of Breath and Swelling of Extremities. Gastrointestinal Not Present- Abdominal Pain, Bloating, Bloody Stool, Change in  Bowel Habits, Chronic diarrhea, Constipation, Difficulty Swallowing, Excessive gas, Gets full quickly at meals, Hemorrhoids, Indigestion, Nausea, Rectal Pain and Vomiting. Male Genitourinary Not Present- Blood in Urine, Change in Urinary Stream, Frequency, Impotence, Nocturia, Painful Urination, Urgency and Urine Leakage. Musculoskeletal Not Present- Back Pain, Joint Pain, Joint Stiffness, Muscle Pain, Muscle Weakness and Swelling of Extremities. Neurological Not Present- Decreased Memory, Fainting, Headaches, Numbness, Seizures, Tingling, Tremor, Trouble walking and Weakness. Psychiatric Not Present- Anxiety, Bipolar, Change in Sleep Pattern, Depression, Fearful and Frequent crying. Endocrine Not Present- Cold Intolerance, Excessive Hunger, Hair Changes, Heat Intolerance, Hot flashes and New Diabetes. Hematology Not Present- Blood Thinners, Easy Bruising, Excessive bleeding, Gland problems, HIV and Persistent Infections.  Vitals [employer] Haggett RMA; 04/29/2020 11:01 AM) 04/29/2020 11:01 AM Weight: 258.2 lb Height: 74in Body Surface Area: 2.42 m Body Mass Index: 33.15 kg/m  Temp.: 97.75F(Temporal)  Pulse: 92 (Regular)  P.OX: 100% (Room air) BP: 140/82(Sitting, Left Arm, Standard)        Physical Exam Ardeth Sportsman MD; 04/29/2020 11:37 AM)  General Mental Status-Alert. General Appearance-Not in acute distress, Not Sickly. Orientation-Oriented X3. Hydration-Well hydrated. Voice-Normal.  Integumentary Global Assessment Upon inspection and palpation of skin surfaces of the - Axillae: non-tender, no inflammation or ulceration, no drainage. and Distribution of scalp and body hair is normal. General Characteristics Temperature - normal warmth is noted.  Head and  Neck Head-normocephalic, atraumatic with no lesions or palpable masses. Face Global Assessment - atraumatic, no absence of expression. Neck Global Assessment - no abnormal movements, no bruit auscultated on the right, no bruit auscultated on the left, no decreased range of motion, non-tender. Trachea-midline. Thyroid Gland Characteristics - non-tender.  Eye Eyeball - Left-Extraocular movements intact, No Nystagmus - Left. Eyeball - Right-Extraocular movements intact, No Nystagmus - Right. Cornea - Left-No Hazy - Left. Cornea - Right-No Hazy - Right. Sclera/Conjunctiva - Left-No scleral icterus, No Discharge - Left. Sclera/Conjunctiva - Right-No scleral icterus, No Discharge - Right. Pupil - Left-Direct reaction to light normal. Pupil - Right-Direct reaction to light normal.  ENMT Ears Pinna - Left - no drainage observed, no generalized tenderness observed. Pinna - Right - no drainage observed, no generalized tenderness observed. Nose and Sinuses External Inspection of the Nose - no destructive lesion observed. Inspection of the nares - Left - quiet respiration. Inspection of the nares - Right - quiet respiration. Mouth and Throat Lips - Upper Lip - no fissures observed, no pallor noted. Lower Lip - no fissures observed, no pallor noted. Nasopharynx - no discharge present. Oral Cavity/Oropharynx - Tongue - no dryness observed. Oral Mucosa - no cyanosis observed. Hypopharynx - no evidence of airway distress observed.  Chest and Lung Exam Inspection Movements - Normal and Symmetrical. Accessory muscles - No use of accessory muscles in breathing. Palpation Palpation of the chest reveals - Non-tender. Auscultation Breath sounds - Normal and Clear.  Cardiovascular Auscultation Rhythm - Regular. Murmurs & Other Heart Sounds - Auscultation of the heart reveals - No Murmurs and No Systolic Clicks.  Abdomen Inspection Inspection of the abdomen reveals - No Visible  peristalsis and No Abnormal pulsations. Umbilicus - No Bleeding, No Urine drainage. Palpation/Percussion Palpation and Percussion of the abdomen reveal - Soft, Non Tender, No Rebound tenderness, No Rigidity (guarding) and No Cutaneous hyperesthesia. Note: Abdomen obese but soft. Moderate suprapubic umbilical diastases recti. Small but definite umbilical hernia on cough. No guarding.  Male Genitourinary Sexual Maturity Tanner 5 - Adult hair pattern and Adult penile size and shape. Note: Left greater  than right groin sensitivity with impulse on Valsalva suspicious for small inguinal hernias. Otherwise normal external male genitalia. No inguinal lymphadenopathy. No folliculitis abscess or cyst. Mild testicular sensitivity left greater than right  Peripheral Vascular Upper Extremity Inspection - Left - No Cyanotic nailbeds - Left, Not Ischemic. Inspection - Right - No Cyanotic nailbeds - Right, Not Ischemic.  Neurologic Neurologic evaluation reveals -normal attention span and ability to concentrate, able to name objects and repeat phrases. Appropriate fund of knowledge , normal sensation and normal coordination. Mental Status Affect - not angry, not paranoid. Cranial Nerves-Normal Bilaterally. Gait-Normal.  Neuropsychiatric Mental status exam performed with findings of-able to articulate well with normal speech/language, rate, volume and coherence, thought content normal with ability to perform basic computations and apply abstract reasoning and no evidence of hallucinations, delusions, obsessions or homicidal/suicidal ideation.  Musculoskeletal Global Assessment Spine, Ribs and Pelvis - no instability, subluxation or laxity. Right Upper Extremity - no instability, subluxation or laxity.  Lymphatic Head & Neck  General Head & Neck Lymphatics: Bilateral - Description - No Localized lymphadenopathy. Axillary  General Axillary Region: Bilateral - Description - No Localized  lymphadenopathy. Femoral & Inguinal  Generalized Femoral & Inguinal Lymphatics: Left - Description - No Localized lymphadenopathy. Right - Description - No Localized lymphadenopathy.    Assessment & Plan Adin Hector MD; 04/29/2020 11:35 AM)  LEFT INGUINAL HERNIA (K40.90) Impression: Classic story for left inguinal hernia. Impulse suspected on exam. He seems rather sensitive on the right side as well, raising the suspicion of a contralateral hernia.  I think he would benefit from laparoscopic exploration and repair of hernias found. He does have an umbilical hernia. Does have diastases and obesity but it is not a large defect, so I probably would keep it simple and just do primary repair only, reserving periumbilical mesh repair for or significant hernia.  He is worried about taking time off work. I noted he will need several weeks before considering light duty in 6 weeks before he is unrestricted. We can do FMLA and try and work to protect his job perioperatively. We gave him information. I think he wants to discuss with his primary care doctor when he has his annual examination next month.   UMBILICAL HERNIA WITHOUT OBSTRUCTION AND WITHOUT GANGRENE (K42.9) Impression: All but definite umbilical hernia. Mildly sensitive. Would offer primary repair only. He does have risk of recurrence with his obesity and diastases recti and tobacco abuse in the past, but I would keep it simple for now & try to avoid a periumbilical mesh repair.   PREOP - ING HERNIA - ENCOUNTER FOR PREOPERATIVE EXAMINATION FOR GENERAL SURGICAL PROCEDURE (Z01.818)  Current Plans You are being scheduled for surgery- Our schedulers will call you.  You should hear from our office's scheduling department within 5 working days about the location, date, and time of surgery. We try to make accommodations for patient's preferences in scheduling surgery, but sometimes the OR schedule or the surgeon's schedule prevents [place] from  making those accommodations.  If you have not heard from our office [phone]) in 5 working days, call the office and ask for your surgeon's nurse.  If you have other questions about your diagnosis, plan, or surgery, call the office and ask for your surgeon's nurse.  Written instructions provided The anatomy & physiology of the abdominal wall and pelvic floor was discussed. The pathophysiology of hernias in the inguinal and pelvic region was discussed. Natural history risks such as progressive enlargement, pain, incarceration, and  strangulation was discussed. Contributors to complications such as smoking, obesity, diabetes, prior surgery, etc were discussed.  I feel the risks of no intervention will lead to serious problems that outweigh the operative risks; therefore, I recommended surgery to reduce and repair the hernia. I explained laparoscopic techniques with possible need for an open approach. I noted usual use of mesh to patch and/or buttress hernia repair  Risks such as bleeding, infection, abscess, need for further treatment, heart attack, death, and other risks were discussed. I noted a good likelihood this will help address the problem. Goals of post-operative recovery were discussed as well. Possibility that this will not correct all symptoms was explained. I stressed the importance of low-impact activity, aggressive pain control, avoiding constipation, & not pushing through pain to minimize risk of post-operative chronic pain or injury. Possibility of reherniation was discussed. We will work to minimize complications.  An educational handout further explaining the pathology & treatment options was given as well. Questions were answered. The patient expresses understanding & wishes to proceed with surgery.  Pt Education - Pamphlet Given - Laparoscopic Hernia Repair: discussed with patient and provided information. Pt Education - [facility] Pain Control (Lawton Dollinger) Pt Education -  [facility] Hernia Post-Op HCI (Janine Reller): discussed with patient and provided information. Pt Education - [facility] Mesh education: discussed with patient and provided information  Ardeth Sportsman, MD, FACS, MASCRS Gastrointestinal and Minimally Invasive Surgery  [facility] [address] [place], [place] [address] [phone] Fax [phone] Main/Paging  CONTACT INFORMATION: Weekday (9AM-5PM) concerns: Call [facility] main office at [phone] Weeknight (5PM-9AM) or Weekend/Holiday concerns: Check [email or web address] for General Surgery [facility] coverage (Please, do not use SecureChat as it is not reliable communication to surgeons for patient care)

## 2020-09-06 ENCOUNTER — Other Ambulatory Visit: Payer: Self-pay

## 2020-09-06 ENCOUNTER — Ambulatory Visit (HOSPITAL_COMMUNITY)
Admission: EM | Admit: 2020-09-06 | Discharge: 2020-09-06 | Disposition: A | Discharge status: Home / Self Care | Payer: 59

## 2020-09-06 ENCOUNTER — Ambulatory Visit (INDEPENDENT_AMBULATORY_CARE_PROVIDER_SITE_OTHER): Payer: 59

## 2020-09-06 ENCOUNTER — Encounter (HOSPITAL_COMMUNITY): Payer: Self-pay

## 2020-09-06 DIAGNOSIS — S62334A Displaced fracture of neck of fourth metacarpal bone, right hand, initial encounter for closed fracture: Secondary | ICD-10-CM

## 2020-09-06 DIAGNOSIS — S6991XA Unspecified injury of right wrist, hand and finger(s), initial encounter: Secondary | ICD-10-CM | POA: Diagnosis not present

## 2020-09-06 DIAGNOSIS — M79641 Pain in right hand: Secondary | ICD-10-CM | POA: Diagnosis not present

## 2020-09-06 MED ORDER — IBUPROFEN 800 MG PO TABS: 800.0000 mg | ORAL_TABLET | Freq: Three times a day (TID) | ORAL | 0 refills | Status: DC

## 2020-09-06 MED ORDER — HYDROCODONE-ACETAMINOPHEN 5-325 MG PO TABS: 1.0000 | ORAL_TABLET | Freq: Four times a day (QID) | ORAL | 0 refills | Status: DC | PRN
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
## 2020-09-06 NOTE — Discharge Instructions (Signed)
You have a fracture of the hand.  We are placing you in a splint.  You need to ice the hand 2-3 times a day Ibuprofen for pain, inflammation and swelling. Hydrocodone for severe pain as needed. Call orthopedics for follow-up

## 2020-09-06 NOTE — Progress Notes (Signed)
Orthopedic Tech Progress Note Patient Details:  Anthony Leblanc 05/21/1966 [id number]  Ortho Devices Type of Ortho Device: Ulna gutter splint Ortho Device/Splint Location: Right Upper Extremity Ortho Device/Splint Interventions: Ordered, Application   Post Interventions Patient Tolerated: Well Instructions Provided: Adjustment of device, Care of device, Poper ambulation with device   [name] P Harle Stanford 09/06/2020, 4:32 PM
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
## 2021-04-15 ENCOUNTER — Encounter (HOSPITAL_COMMUNITY): Payer: Self-pay | Admitting: Emergency Medicine

## 2021-04-15 ENCOUNTER — Other Ambulatory Visit: Payer: Self-pay

## 2021-04-15 ENCOUNTER — Ambulatory Visit (HOSPITAL_COMMUNITY)
Admission: EM | Admit: 2021-04-15 | Discharge: 2021-04-15 | Disposition: A | Discharge status: Home / Self Care | Payer: 59 | Attending: Medical Oncology | Admitting: Medical Oncology

## 2021-04-15 DIAGNOSIS — H9312 Tinnitus, left ear: Secondary | ICD-10-CM

## 2021-04-15 DIAGNOSIS — H9202 Otalgia, left ear: Secondary | ICD-10-CM | POA: Diagnosis not present

## 2021-04-15 DIAGNOSIS — H6123 Impacted cerumen, bilateral: Secondary | ICD-10-CM | POA: Diagnosis not present

## 2021-04-15 DIAGNOSIS — H6122 Impacted cerumen, left ear: Secondary | ICD-10-CM

## 2021-04-15 NOTE — ED Provider Notes (Addendum)
[facility]    CSN: [id number] Arrival date & time: 04/15/21  1403      History   Chief Complaint Chief Complaint  Patient presents with  . Otalgia  . Tinnitus    HPI Anthony Leblanc is a 55 y.o. male.   HPI  Otalgia: Patient reports a history of left ear pain, ringing and swelling x2 months.  Over the last few days symptoms have become more noticeable per pt. NO injury of the ear. No fevers, dental pain, jaw pain or flu like symptoms. He has not tried anything for pain.    Past Medical History:  Diagnosis Date  . Disturbance of skin sensation   . Family history of diabetes mellitus   . Hypersomnia, unspecified   . Obesity, unspecified   . Pain in joint, lower leg    Patella-femoral syndrome  . Pure hypercholesterolemia     There are no problems to display for this patient.   Past Surgical History:  Procedure Laterality Date  . HEMORRHOID SURGERY       Home Medications    Prior to Admission medications   Medication Sig Start Date End Date Taking? Authorizing Provider  atorvastatin (LIPITOR) 20 MG tablet Take 20 mg by mouth daily.    [provider]  atorvastatin (LIPITOR) 40 MG tablet Take 40 mg by mouth daily. 05/15/20   [provider]  HYDROcodone-acetaminophen (NORCO/VICODIN) 5-325 MG tablet Take 1-2 tablets by mouth every 6 (six) hours as needed. 09/06/20   Dahlia Byes A, NP  ibuprofen (ADVIL) 800 MG tablet Take 1 tablet (800 mg total) by mouth 3 (three) times daily. 09/06/20   Dahlia Byes A, NP  Multiple Vitamin (MULTIVITAMIN) tablet Take 1 tablet by mouth daily.    [provider]  omega-3 acid ethyl esters (LOVAZA) 1 G capsule Take 4 capsules by mouth daily.    [provider]  sildenafil (VIAGRA) 100 MG tablet Take 100 mg by mouth daily as needed for erectile dysfunction.    [provider]    Family History Family History  Problem Relation Age of Onset  . Cancer Father   . Diabetes Mother      Social History Social History   Tobacco Use  . Smoking status: Never Smoker  . Smokeless tobacco: Never Used  Substance Use Topics  . Alcohol use: Yes    Alcohol/week: 4.0 standard drinks    Types: 4 Standard drinks or equivalent per week  . Drug use: Yes    Types: Cocaine     Allergies   Patient has no known allergies.   Review of Systems Review of Systems  As stated  Above in HPI Physical Exam Triage Vital Signs ED Triage Vitals  Enc Vitals Group     BP 04/15/21 1424 124/65     Pulse Rate 04/15/21 1424 (!) 107     Resp 04/15/21 1424 17     Temp 04/15/21 1424 98.6 F (37 C)     Temp Source 04/15/21 1424 Oral     SpO2 04/15/21 1424 97 %     Weight --      Height --      Head Circumference --      Peak Flow --      Pain Score 04/15/21 1422 8     Pain Loc --      Pain Edu? --      Excl. in GC? --    No data found.  Updated Vital  Signs BP 124/65 (BP Location: Left Arm)   Pulse (!) 107   Temp 98.6 F (37 C) (Oral)   Resp 17   SpO2 97%   Physical Exam Vitals and nursing note reviewed.  Constitutional:      General: He is not in acute distress.    Appearance: Normal appearance. He is not ill-appearing, toxic-appearing or diaphoretic.  HENT:     Head: Normocephalic and atraumatic.     Right Ear: There is impacted cerumen.     Left Ear: There is impacted cerumen.     Ears:     Comments: Following irrigation 10% of cerumen left intact but without erythema or perforation     Nose: Nose normal.     Mouth/Throat:     Mouth: Mucous membranes are moist.     Pharynx: Oropharynx is clear. No oropharyngeal exudate or posterior oropharyngeal erythema.  Eyes:     Extraocular Movements: Extraocular movements intact.     Pupils: Pupils are equal, round, and reactive to light.  Cardiovascular:     Rate and Rhythm: Normal rate and regular rhythm.     Heart sounds: Normal heart sounds.  Pulmonary:     Effort: Pulmonary effort is normal.     Breath sounds:  Normal breath sounds.  Musculoskeletal:     Cervical back: Normal range of motion and neck supple. No rigidity or tenderness.  Lymphadenopathy:     Cervical: No cervical adenopathy.  Skin:    General: Skin is warm.  Neurological:     Mental Status: He is alert and oriented to person, place, and time.      UC Treatments / Results  Labs (all labs ordered are listed, but only abnormal results are displayed) Labs Reviewed - No data to display  EKG   Radiology No results found.  Procedures Procedures (including critical care time)  Medications Ordered in UC Medications - No data to display  Initial Impression / Assessment and Plan / UC Course  I have reviewed the triage vital signs and the nursing notes.  Pertinent labs & imaging results that were available during my care of the patient were reviewed by me and considered in my medical decision making (see chart for details).     Ear lavage performed by nursing staff which patient tolerated exceptionally well.  He noted full resolution of symptoms following the lavage.  Home instructions given to patient..  Final Clinical Impressions(s) / UC Diagnoses   Final diagnoses:  None   Discharge Instructions   None    ED Prescriptions    None     PDMP not reviewed this encounter.   Rushie Chestnut, PA-C 04/15/21 1558    [address] PA-C 04/15/21 1559

## 2021-04-15 NOTE — ED Triage Notes (Signed)
Pt presents with left ear pain, ringing, and swelling xs 2 months. States pain has gotten worse over the past couple days.

## 2021-09-05 ENCOUNTER — Other Ambulatory Visit: Payer: Self-pay | Admitting: Physician Assistant

## 2021-09-05 DIAGNOSIS — R1030 Lower abdominal pain, unspecified: Secondary | ICD-10-CM

## 2021-09-08 ENCOUNTER — Encounter (HOSPITAL_COMMUNITY): Payer: Self-pay

## 2021-09-08 ENCOUNTER — Other Ambulatory Visit: Payer: Self-pay

## 2021-09-08 ENCOUNTER — Ambulatory Visit (HOSPITAL_COMMUNITY)
Admission: EM | Admit: 2021-09-08 | Discharge: 2021-09-08 | Disposition: A | Discharge status: Home / Self Care | Payer: 59 | Attending: Family Medicine | Admitting: Family Medicine

## 2021-09-08 DIAGNOSIS — E785 Hyperlipidemia, unspecified: Secondary | ICD-10-CM | POA: Diagnosis not present

## 2021-09-08 DIAGNOSIS — R1033 Periumbilical pain: Secondary | ICD-10-CM

## 2021-09-08 MED ORDER — ATORVASTATIN CALCIUM 40 MG PO TABS
40.0000 mg | ORAL_TABLET | Freq: Every day | ORAL | 1 refills | Status: DC
Start: 2021-09-08 — End: 2021-12-23

## 2021-09-08 NOTE — ED Triage Notes (Signed)
Patient presents to Urgent Care with complaints of abdominal pain since Monday. Pt states he has a hx of abdominal pain. He states he has a hernia below the umbilicus about x 2 years ago. He states he decline the hernia surgery. He is unsure if increase pain is related to the hernia. Treating pain with mobic.   Denies fever, n/v, or diarrhea.

## 2021-09-08 NOTE — ED Provider Notes (Signed)
[facility]   [id number] 09/08/21 Arrival Time: 1440  ASSESSMENT & PLAN:  1. Periumbilical abdominal pain   2. Hyperlipidemia, unspecified hyperlipidemia type    Benign abdominal exam. No indications for urgent abdominal/pelvic imaging at this time. Discussed.  Refill at pt request: Meds ordered this encounter  Medications   atorvastatin (LIPITOR) 40 MG tablet    Sig: Take 1 tablet (40 mg total) by mouth daily.    Dispense:  30 tablet    Refill:  1   No signs of strangulated hernia. May schedule outpatient eval.   Follow-up Information     Schedule an appointment as soon as possible for a visit  with [facility], [place].   Specialty: General Surgery Contact information: [address] [place] [address] [phone]         [facility] EMERGENCY DEPARTMENT.   Specialty: Emergency Medicine Why: If symptoms worsen in any way. Contact information: [address] [id number] m[place] [address] [phone]                 Reviewed expectations re: course of current medical issues. Questions answered. Outlined signs and symptoms indicating need for more acute intervention. Patient verbalized understanding. After Visit Summary given.   SUBJECTIVE: History from: patient. Anthony Leblanc is a 55 y.o. male who has been told he has an peri-umbilical hernia presents with complaint of intermittent pain around umbilicus; more to the left; over past sev days. Not severe. Afebrile. No n/v. Mobic with some relief. Does not wake him at night. Normal bowel/bladder habits.  Past Surgical History:  Procedure Laterality Date   HEMORRHOID SURGERY     Also requests refill of Lipitor.  OBJECTIVE:  Vitals:   09/08/21 1547  BP: 116/75  Pulse: 91  Resp: 18  Temp: 97.6 F (36.4 C)  TempSrc: Oral  SpO2: 96%    General appearance: alert, oriented, no acute distress HEENT: [place]; AT; oropharynx  moist Lungs: unlabored respirations Abdomen: soft; without distention; mild  and poorly localized tenderness to palpation over abdomen just to left of umbilicus; some bulging consistent with hernia ; normal bowel sounds; without masses or organomegaly; without guarding or rebound tenderness Back: without reported CVA tenderness; FROM at waist Extremities: without LE edema; symmetrical; without gross deformities Skin: warm and dry Neurologic: normal gait Psychological: alert and cooperative; normal mood and affect   No Known Allergies                                             Past Medical History:  Diagnosis Date   Disturbance of skin sensation    Family history of diabetes mellitus    Hypersomnia, unspecified    Obesity, unspecified    Pain in joint, lower leg    Patella-femoral syndrome   Pure hypercholesterolemia     Social History   Socioeconomic History   Marital status: Married    Spouse name: Not on file   Number of children: Not on file   Years of education: Not on file   Highest education level: Not on file  Occupational History   Not on file  Tobacco Use   Smoking status: Never   Smokeless tobacco: Never  Vaping Use   Vaping Use: Never used  Substance and Sexual Activity   Alcohol use: Yes    Alcohol/week: 4.0  standard drinks    Types: 4 Standard drinks or equivalent per week   Drug use: Yes    Types: Cocaine   Sexual activity: Not on file  Other Topics Concern   Not on file  Social History Narrative   Not on file   Social Determinants of Health   Financial Resource Strain: Not on file  Food Insecurity: Not on file  Transportation Needs: Not on file  Physical Activity: Not on file  Stress: Not on file  Social Connections: Not on file  Intimate Partner Violence: Not on file    Family History  Problem Relation Age of Onset   Cancer Father    Diabetes Mother      Mardella Layman, MD 09/08/21 [phone]

## 2021-10-03 ENCOUNTER — Ambulatory Visit: Payer: Self-pay | Admitting: Surgery

## 2021-10-27 IMAGING — DX DG HAND COMPLETE 3+V*R*
3 series · 3 of 3 positions shown · non-contrast
Comparison: None.

CLINICAL DATA: Right hand pain after injury last week.

EXAM:
RIGHT HAND - COMPLETE 3+ VIEW

[hand pa]
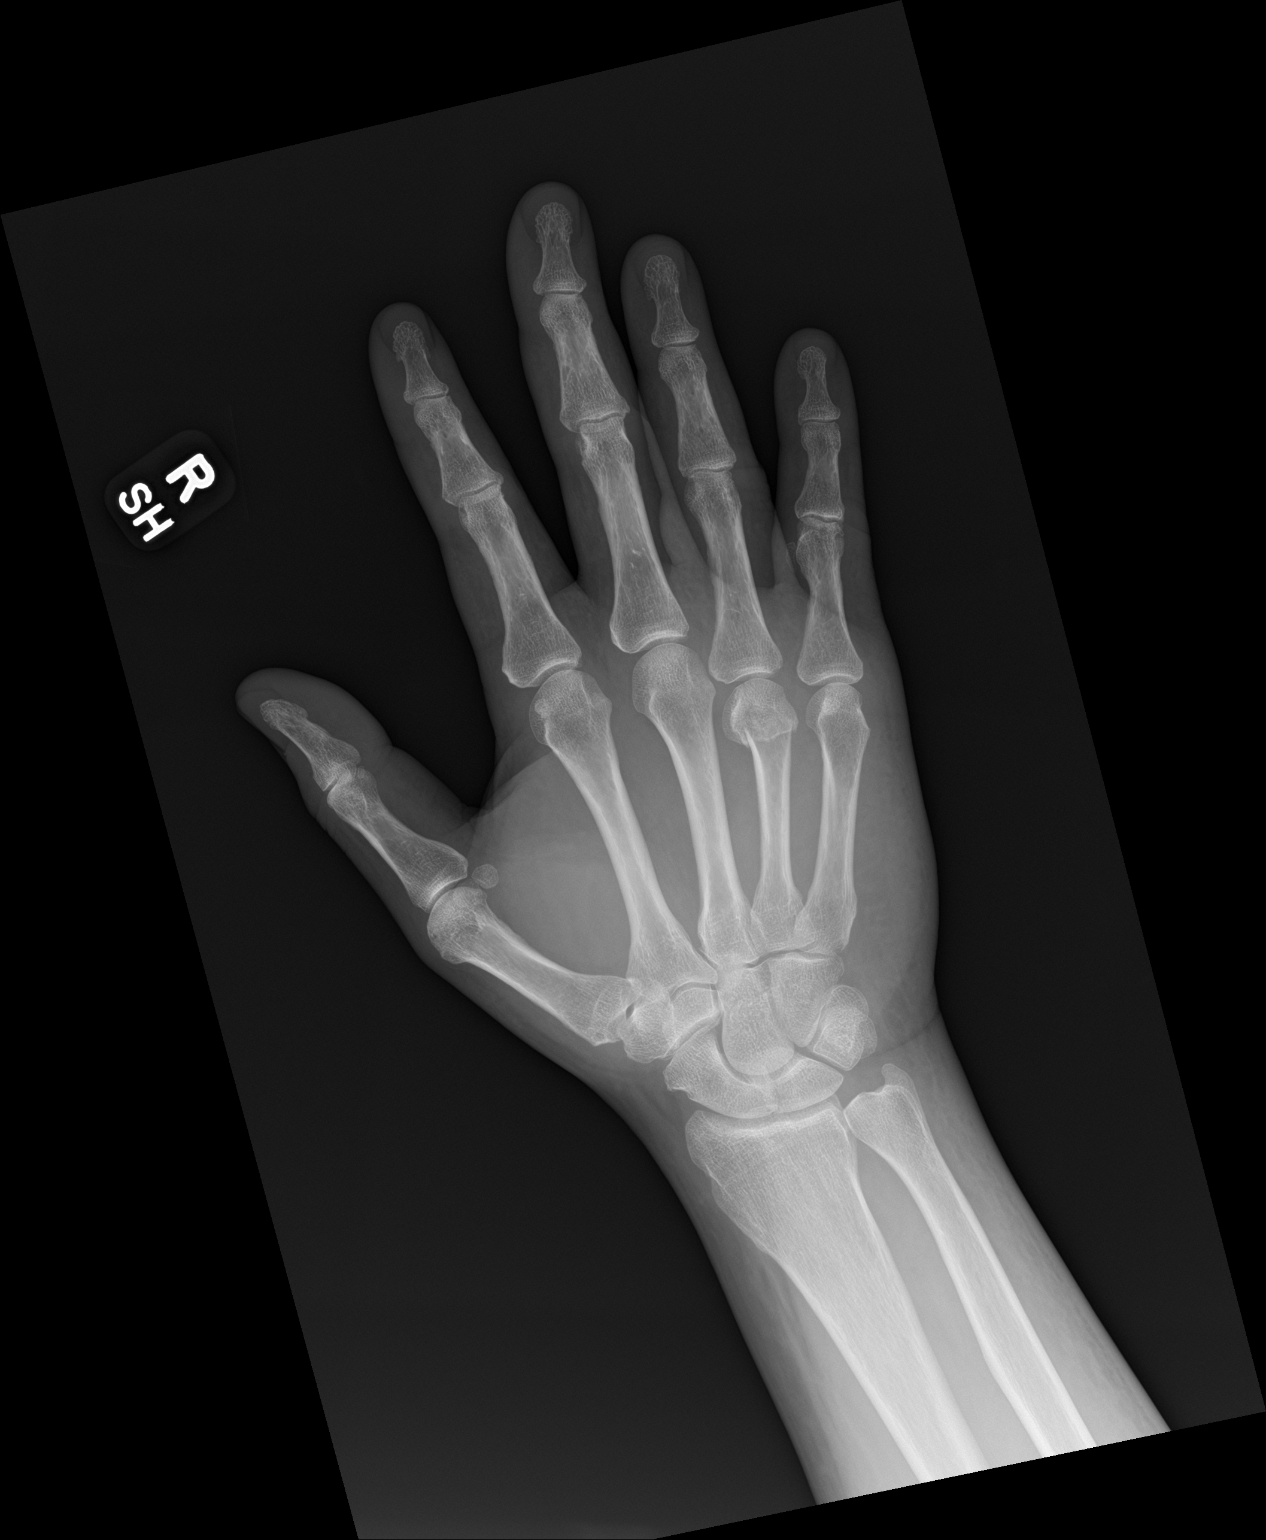

[hand obl]
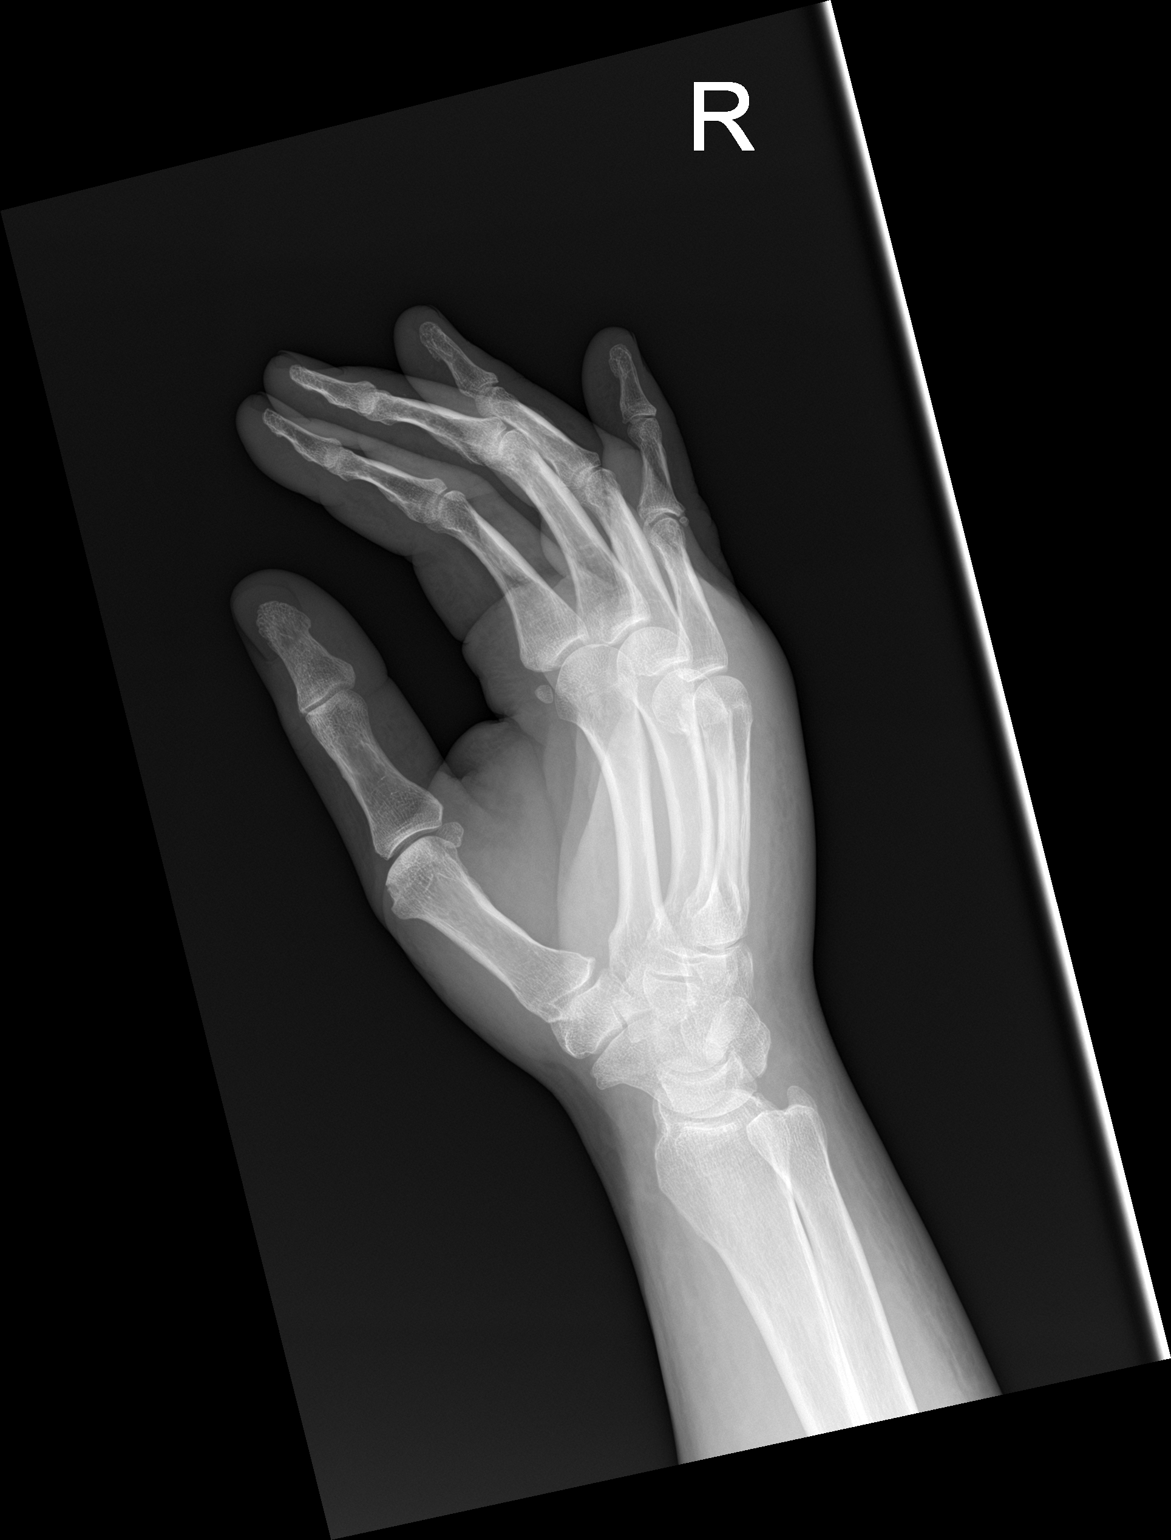

[hand lat]
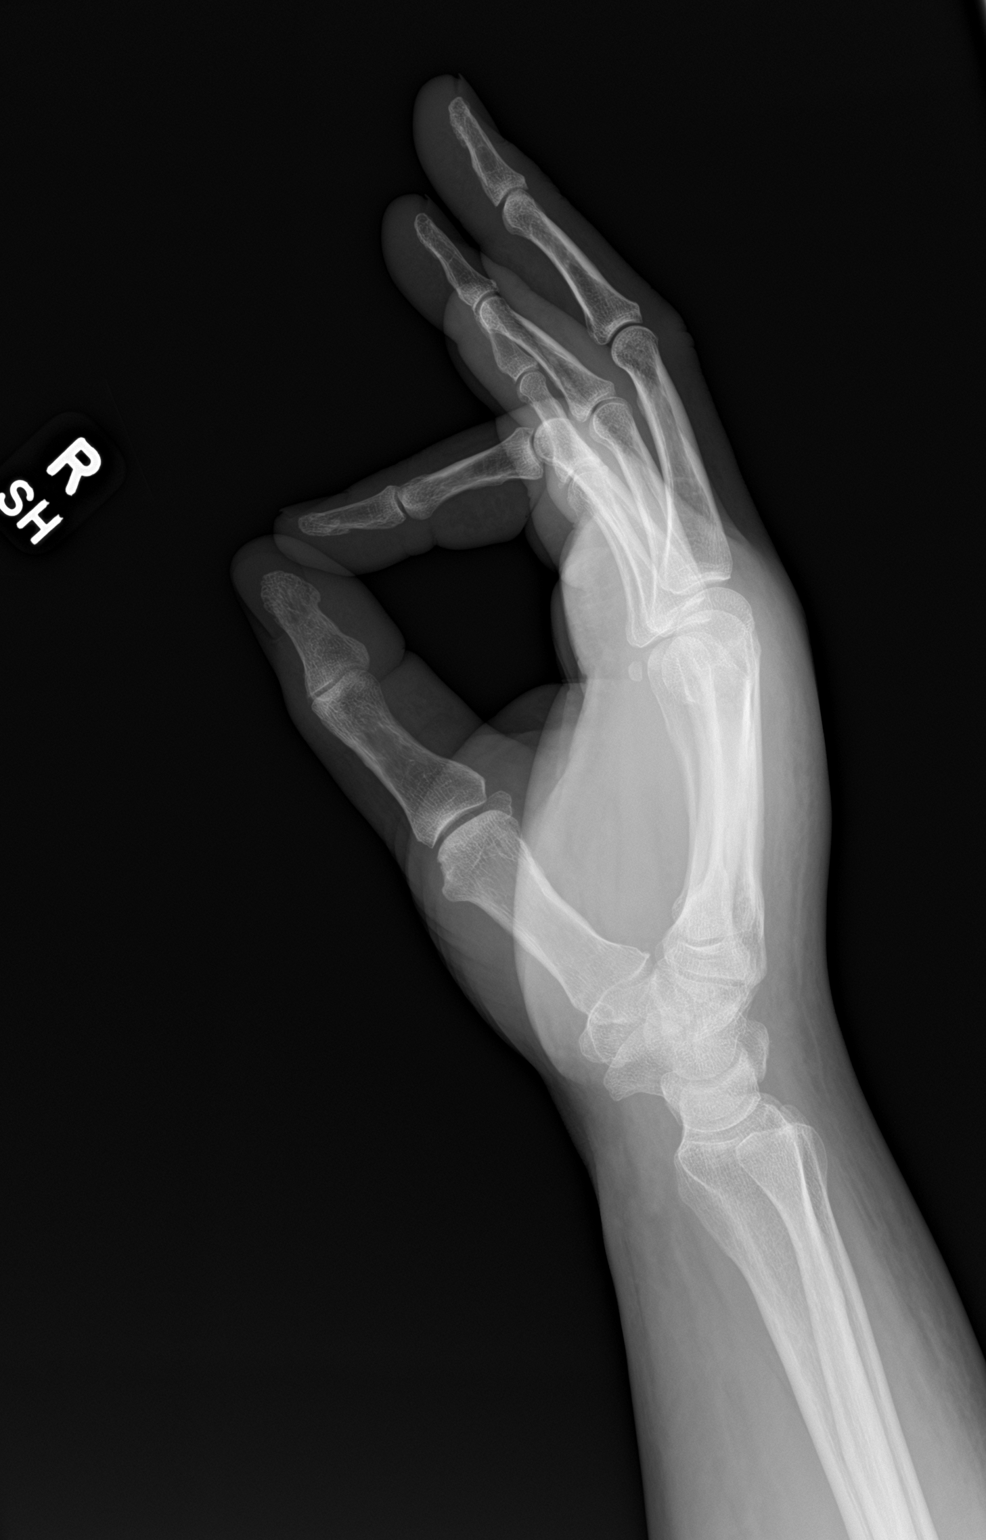

[3 of 3 positions shown; findings below may reference images not displayed]

FINDINGS: Mildly angulated distal fourth metacarpal fracture is noted. No
other bony abnormality is noted. Soft tissues are unremarkable.
IMPRESSION: Mildly angulated distal fourth metacarpal fracture.

## 2021-12-10 ENCOUNTER — Other Ambulatory Visit: Payer: Self-pay

## 2021-12-10 DIAGNOSIS — I739 Peripheral vascular disease, unspecified: Secondary | ICD-10-CM

## 2021-12-22 NOTE — Progress Notes (Signed)
Office Note     CC: Cramping in the left thigh Requesting Provider:  Hillery Aldo, Demetrius Charity*  HPI: Anthony Leblanc is a 55 y.o. (15-Jan-1966) male presenting at the request of .Zachery Dauer, FNP cramping in the left thigh.  On exam today, he was doing well.  He works full-time Visual merchandiser.  Over the last several months, he is appreciated cramping in his left thigh.  This is appreciated most with standing.  He does not appreciate it when working out nor does it worsen with ambulation.  In some cases, movement actually helps the pain in the thigh.  Anthony Leblanc denies symptoms of claudication, tissue loss, rest pain in his calves and feet. Over the last year, he has put on a significant mount of weight, now weighing 289 pounds.  He is very concerned about his weight as this is led to decreased exercise, and poor physical fitness.  The pt is  on a statin for cholesterol management.  The pt is not on a daily aspirin.   Other AC:  - The pt is not on medication for hypertension.   The pt is not diabetic.  Tobacco hx:  none  Past Medical History:  Diagnosis Date   Disturbance of skin sensation    Family history of diabetes mellitus    Hypersomnia, unspecified    Obesity, unspecified    Pain in joint, lower leg    Patella-femoral syndrome   Pure hypercholesterolemia     Past Surgical History:  Procedure Laterality Date   HEMORRHOID SURGERY      Social History   Socioeconomic History   Marital status: Married    Spouse name: Not on file   Number of children: Not on file   Years of education: Not on file   Highest education level: Not on file  Occupational History   Not on file  Tobacco Use   Smoking status: Never   Smokeless tobacco: Never  Vaping Use   Vaping Use: Never used  Substance and Sexual Activity   Alcohol use: Yes    Alcohol/week: 4.0 standard drinks    Types: 4 Standard drinks or equivalent per week   Drug use: Yes    Types: Cocaine   Sexual activity: Not  on file  Other Topics Concern   Not on file  Social History Narrative   Not on file   Social Determinants of Health   Financial Resource Strain: Not on file  Food Insecurity: Not on file  Transportation Needs: Not on file  Physical Activity: Not on file  Stress: Not on file  Social Connections: Not on file  Intimate Partner Violence: Not on file    Family History  Problem Relation Age of Onset   Cancer Father    Diabetes Mother     Current Outpatient Medications  Medication Sig Dispense Refill   atorvastatin (LIPITOR) 40 MG tablet Take 1 tablet (40 mg total) by mouth daily. 30 tablet 1   HYDROcodone-acetaminophen (NORCO/VICODIN) 5-325 MG tablet Take 1-2 tablets by mouth every 6 (six) hours as needed. 10 tablet 0   ibuprofen (ADVIL) 800 MG tablet Take 1 tablet (800 mg total) by mouth 3 (three) times daily. 21 tablet 0   meloxicam (MOBIC) 15 MG tablet Take 15 mg by mouth daily.     Multiple Vitamin (MULTIVITAMIN) tablet Take 1 tablet by mouth daily.     omega-3 acid ethyl esters (LOVAZA) 1 G capsule Take 4 capsules by mouth daily.  sildenafil (VIAGRA) 100 MG tablet Take 100 mg by mouth daily as needed for erectile dysfunction.     No current facility-administered medications for this visit.    No Known Allergies   REVIEW OF SYSTEMS:   [X]  denotes positive finding, [ ]  denotes negative finding Cardiac  Comments:  Chest pain or chest pressure:    Shortness of breath upon exertion:    Short of breath when lying flat:    Irregular heart rhythm:        Vascular    Pain in calf, thigh, or hip brought on by ambulation:    Pain in feet at night that wakes you up from your sleep:     Blood clot in your veins:    Leg swelling:         Pulmonary    Oxygen at home:    Productive cough:     Wheezing:         Neurologic    Sudden weakness in arms or legs:     Sudden numbness in arms or legs:     Sudden onset of difficulty speaking or slurred speech:    Temporary loss  of vision in one eye:     Problems with dizziness:         Gastrointestinal    Blood in stool:     Vomited blood:         Genitourinary    Burning when urinating:     Blood in urine:        Psychiatric    Major depression:         Hematologic    Bleeding problems:    Problems with blood clotting too easily:        Skin    Rashes or ulcers:        Constitutional    Fever or chills:      PHYSICAL EXAMINATION:  There were no vitals filed for this visit.  General:  WDWN in NAD; vital signs documented above Gait: Not observed HENT: WNL, normocephalic Pulmonary: normal non-labored breathing , without wheezing Cardiac: regular HR,  Abdomen: soft, NT, no masses Skin: without rashes Vascular Exam/Pulses:  Right Left  Radial 2+ (normal) 2+ (normal)  Ulnar 2+ (normal) 2+ (normal)  Femoral 2+ (normal) 2+ (normal)  Popliteal    DP 2+ (normal) 2+ (normal)       Extremities: without ischemic changes, without Gangrene , without cellulitis; without open wounds;  Musculoskeletal: no muscle wasting or atrophy  Neurologic: A&O X 3;  No focal weakness or paresthesias are detected Psychiatric:  The pt has Normal affect.   Non-Invasive Vascular Imaging:   +-------+-----------+-----------+------------+------------+   ABI/TBI Today's ABI Today's TBI Previous ABI Previous TBI   +-------+-----------+-----------+------------+------------+   Right   1.14        0.89                                    +-------+-----------+-----------+------------+------------+   Left    1.24        0.82                                    +-------+-----------+-----------+------------+------------+       ASSESSMENT/PLAN: Anthony Leblanc is a 55 y.o. male presenting with left thigh cramping.  ABIs were reviewed-these were normal.  The patient's symptoms are concerning for a musculoskeletal etiology, possible iliotibial band syndrome. I do not think there is a vascular etiology.  I had a long  conversation with the patient regarding the above including his concerns with weight loss.  I am glad he is concerned, as there are multiple morbidities associated with his current weight.  Anthony Leblanc would benefit from seeing a dietitian as well as orthopedist/sports medicine physician in an effort to diagnose and treat his obesity and left thigh cramping respectively.  I told him I am available as needed, and would be happy to help in any way possible.   Victorino Sparrow, MD [facility] [phone]

## 2021-12-23 ENCOUNTER — Ambulatory Visit (INDEPENDENT_AMBULATORY_CARE_PROVIDER_SITE_OTHER): Payer: 59 | Admitting: Vascular Surgery

## 2021-12-23 ENCOUNTER — Encounter: Payer: Self-pay | Admitting: Vascular Surgery

## 2021-12-23 ENCOUNTER — Ambulatory Visit (HOSPITAL_COMMUNITY)
Admission: RE | Admit: 2021-12-23 | Discharge: 2021-12-23 | Disposition: A | Discharge status: Home / Self Care | Payer: 59 | Source: Ambulatory Visit | Attending: Vascular Surgery | Admitting: Vascular Surgery

## 2021-12-23 ENCOUNTER — Other Ambulatory Visit: Payer: Self-pay

## 2021-12-23 VITALS — BP 108/74 | HR 81 | Temp 98.0°F | Resp 20 | Ht 74.0 in | Wt 289.6 lb

## 2021-12-23 DIAGNOSIS — I739 Peripheral vascular disease, unspecified: Secondary | ICD-10-CM | POA: Diagnosis present

## 2021-12-23 DIAGNOSIS — R252 Cramp and spasm: Secondary | ICD-10-CM | POA: Diagnosis not present

## 2023-08-22 ENCOUNTER — Other Ambulatory Visit: Payer: Self-pay | Admitting: Physician Assistant

## 2023-08-22 DIAGNOSIS — F17210 Nicotine dependence, cigarettes, uncomplicated: Secondary | ICD-10-CM

## 2023-11-24 NOTE — Progress Notes (Addendum)
 Plastic and Reconstructive Surgery  New Patient Consultation Note   Chief Complaint: Occipital/Neck cystic mass     History of Present Illness:  Subjective Anthony Leblanc is a 57 y.o. male presenting for evaluation of scalp cyst. He states cystic mass formed on is occipita scalp 5-6 years ago but progressively grew larger in the last 2 years. This has started to be bothersome due to location which is impacted when wearing a hat and occasionally when lying down, causing discomfort. He is here to discuss treatment options.   He is healthy overall. Denies pacemaker/ICD, implantable devices. Not on any blood thinners. He denies any swollen nodes, drainage of mass, wounds associated with mass, signs of infection, history of skin cancer.   He states he does smoke cigarettes some days but not everyday.   Past Medical History:  Family history of diabetes mellitus Hypersomnia, unspecified Obesity, unspecified Pain in joint, lower leg Patella-femoral syndrome Pure hypercholesterolemia  Past Surgical History: Procedure Laterality Date HEMORRHOID SURGERY   Social History: Social History   Tobacco Use  . Smoking status: Some Days    Types: Cigarettes  . Smokeless tobacco: Never  Substance Use Topics  . Alcohol use: Not on file    Family History: family history is not on file.  Allergies: No Known Allergies  Medications: has a current medication list which includes the following prescription(s): atorvastatin , meloxicam, multivitamin, omega-3 acid ethyl esters, and tadalafil.  Review of Systems: A complete ROS was performed with pertinent positives/negatives noted in the HPI. The remainder of the ROS are negative.  Physical Examination:  height is 1.88 m (6' 2) and weight is 111 kg (245 lb 9.6 oz). His skin temperature is 97.5 F (36.4 C). His blood pressure is 123/74 and his pulse is 61.  Body mass index is 31.53 kg/m.  Gen: Well nourished male appears stated age.  HEENT:  Occipital scalp/neck mass is lipomatous, mobile, 6 x 4 cm. This is non-tender to palpation. No overlying punctum or wounds. Unclear if submuscular or subcutaneous in location. No associated erythema, induration, or signs of infection. No palpable regional LAD CV: No cyanosis, no JVD Resp: normal work of breathing on room air  Extremities: no clubbing or cyanosis  Skin: No suspicious lesions, moderate skin quality.    Assessment and Plan:  57 y.o. male with posterior scalp cyst, likely lipoma.   I discussed excision of mass under local versus sedation. I specifically discussed risks including bleeding, scarring, recurrence of draining lesion, finding of malignancy, bleeding, damage to surrounding structures, pain, and need for future treatments or revisionary surgery. I discussed that though rare, this could be malignant and that I would plan to send the excisional specimen to pathology. He understands that this is in essence trading the presence of the lesion for a scar which is unavoidable. I explained that the lesion may recur despite my best efforts to remove it. He understands and accepts these risks and would like to pursue removal.  Due to size and location it would be safer and more expeditious to do this in the OR. He is about to start a new job and would like to wait until January 2025. Will schedule when he is ready - he knows to contact our clinic early 2025.

## 2024-05-26 ENCOUNTER — Ambulatory Visit (HOSPITAL_COMMUNITY)
Admission: EM | Admit: 2024-05-26 | Discharge: 2024-05-26 | Disposition: A | Discharge status: Home / Self Care | Attending: Family Medicine | Admitting: Family Medicine

## 2024-05-26 ENCOUNTER — Encounter (HOSPITAL_COMMUNITY): Payer: Self-pay

## 2024-05-26 DIAGNOSIS — Z113 Encounter for screening for infections with a predominantly sexual mode of transmission: Secondary | ICD-10-CM | POA: Insufficient documentation

## 2024-05-26 DIAGNOSIS — J069 Acute upper respiratory infection, unspecified: Secondary | ICD-10-CM | POA: Insufficient documentation

## 2024-05-26 LAB — POC COVID19/FLU A&B COMBO
Covid Antigen, POC: NEGATIVE
Influenza A Antigen, POC: NEGATIVE
Influenza B Antigen, POC: NEGATIVE

## 2024-05-26 LAB — HIV ANTIBODY (ROUTINE TESTING W REFLEX): HIV Screen 4th Generation wRfx: NONREACTIVE

## 2024-05-26 MED ORDER — METHYLPREDNISOLONE 4 MG PO TBPK
ORAL_TABLET | ORAL | 0 refills | Status: DC
Start: 2024-05-26 — End: 2024-05-26

## 2024-05-26 MED ORDER — METHYLPREDNISOLONE 4 MG PO TBPK: ORAL_TABLET | ORAL | 0 refills | Status: DC

## 2024-05-26 NOTE — Discharge Instructions (Addendum)
 Please follow instructions on your Medrol Dosepak  Please continue stay hydrated and follow-up if you feel like you are not getting better

## 2024-05-26 NOTE — ED Triage Notes (Signed)
 Pt c/o cough, congestion, and weakness since Friday. States needs a work note. States took OTC meds with no relief.   Pt requesting STD testing with blood work. Denies sx's.

## 2024-05-26 NOTE — ED Provider Notes (Signed)
 [facility]    CSN: [id number] Arrival date & time: 05/26/24  1047      History   Chief Complaint Chief Complaint  Patient presents with   Cough   SEXUALLY TRANSMITTED DISEASE    HPI EARVIN BLAZIER is a 58 y.o. male.   Patient is presenting with cough, congestion as well as some muscle aches associated with back since Friday.  Patient notes he does smoke when he drinks but otherwise not daily.  Patient also would like to get STD testing at this time.  Patient has no fevers or chills or significant shortness of breath at this time.   Cough   Past Medical History:  Diagnosis Date   Disturbance of skin sensation    Family history of diabetes mellitus    Hypersomnia, unspecified    Obesity, unspecified    Pain in joint, lower leg    Patella-femoral syndrome   Pure hypercholesterolemia     There are no active problems to display for this patient.   Past Surgical History:  Procedure Laterality Date   HEMORRHOID SURGERY         Home Medications    Prior to Admission medications   Medication Sig Start Date End Date Taking? Authorizing Provider  atorvastatin  (LIPITOR) 20 MG tablet Take 20 mg by mouth daily.    [provider]  ibuprofen  (ADVIL ) 800 MG tablet Take 1 tablet (800 mg total) by mouth 3 (three) times daily. 09/06/20   Jerri Morale A, FNP  meloxicam (MOBIC) 15 MG tablet Take 15 mg by mouth daily. 05/14/21   [provider]  methylPREDNISolone (MEDROL DOSEPAK) 4 MG TBPK tablet Please follow instructions on your Medrol Dosepak 05/26/24   Cari Burgo, MD  Multiple Vitamin (MULTIVITAMIN) tablet Take 1 tablet by mouth daily.    [provider]  omega-3 acid ethyl esters (LOVAZA) 1 G capsule Take 4 capsules by mouth daily.    [provider]  sildenafil (VIAGRA) 100 MG tablet Take 100 mg by mouth daily as needed for erectile dysfunction.    [provider]    Family History Family History  Problem Relation Age  of Onset   Cancer Father    Diabetes Mother     Social History Social History   Tobacco Use   Smoking status: Never   Smokeless tobacco: Never  Vaping Use   Vaping status: Never Used  Substance Use Topics   Alcohol use: Yes    Alcohol/week: 4.0 standard drinks of alcohol    Types: 4 Standard drinks or equivalent per week   Drug use: Yes    Types: Cocaine     Allergies   Patient has no known allergies.   Review of Systems Review of Systems  Respiratory:  Positive for cough.      Physical Exam Triage Vital Signs ED Triage Vitals  Encounter Vitals Group     BP 05/26/24 1102 128/68     Systolic BP Percentile --      Diastolic BP Percentile --      Pulse Rate 05/26/24 1102 99     Resp 05/26/24 1102 18     Temp 05/26/24 1102 98.2 F (36.8 C)     Temp Source 05/26/24 1102 Oral     SpO2 05/26/24 1102 98 %     Weight --      Height --      Head Circumference --      Peak Flow --  Pain Score 05/26/24 1104 0     Pain Loc --      Pain Education --      Exclude from Growth Chart --    No data found.  Updated Vital Signs BP 128/68 (BP Location: Left Arm)   Pulse 99   Temp 98.2 F (36.8 C) (Oral)   Resp 18   SpO2 98%   Visual Acuity Right Eye Distance:   Left Eye Distance:   Bilateral Distance:    Right Eye Near:   Left Eye Near:    Bilateral Near:     Physical Exam Constitutional:      General: He is not in acute distress.    Appearance: Normal appearance.  HENT:     Head: Normocephalic and atraumatic.     Nose: No congestion or rhinorrhea.     Mouth/Throat:     Pharynx: Posterior oropharyngeal erythema present. No oropharyngeal exudate.  Cardiovascular:     Rate and Rhythm: Normal rate and regular rhythm.  Pulmonary:     Effort: Pulmonary effort is normal. No respiratory distress.     Breath sounds: Normal breath sounds. No stridor. No wheezing or rhonchi.  Neurological:     Mental Status: He is alert.      UC Treatments / Results   Labs (all labs ordered are listed, but only abnormal results are displayed) Labs Reviewed  POC COVID19/FLU A&B COMBO - Normal  HIV ANTIBODY (ROUTINE TESTING W REFLEX)  RPR  CYTOLOGY, (ORAL, ANAL, URETHRAL) ANCILLARY ONLY    EKG   Radiology No results found.  Procedures Procedures (including critical care time)  Medications Ordered in UC Medications - No data to display  Initial Impression / Assessment and Plan / UC Course  I have reviewed the triage vital signs and the nursing notes.  Pertinent labs & imaging results that were available during my care of the patient were reviewed by me and considered in my medical decision making (see chart for details).     Flu and COVID-negative, discussed with patient options and patient would like to go ahead with Medrol Dosepak at this time.  Medrol Dosepak prescribed to patient.  Patient advised to keep his phone on him for any STD testing that we may need to call about.  Patient is understanding. Final Clinical Impressions(s) / UC Diagnoses   Final diagnoses:  Viral URI with cough     Discharge Instructions      Please follow instructions on your Medrol Dosepak  Please continue stay hydrated and follow-up if you feel like you are not getting better   ED Prescriptions     Medication Sig Dispense Auth. Provider   methylPREDNISolone (MEDROL DOSEPAK) 4 MG TBPK tablet  (Status: Discontinued) Please call instructions on your Medrol Dosepak 1 each Terrica Duecker, MD   methylPREDNISolone (MEDROL DOSEPAK) 4 MG TBPK tablet Please follow instructions on your Medrol Dosepak 1 each Olamae Ferrara, MD      PDMP not reviewed this encounter.   Jude Norton, MD 05/26/24 1137

## 2024-05-27 ENCOUNTER — Telehealth (HOSPITAL_COMMUNITY): Payer: Self-pay | Admitting: *Deleted

## 2024-05-27 LAB — CYTOLOGY, (ORAL, ANAL, URETHRAL) ANCILLARY ONLY
Chlamydia: NEGATIVE
Comment: NEGATIVE
Comment: NEGATIVE
Comment: NORMAL
Neisseria Gonorrhea: NEGATIVE
Trichomonas: NEGATIVE

## 2024-05-27 LAB — RPR: RPR Ser Ql: NONREACTIVE

## 2024-05-27 NOTE — Telephone Encounter (Signed)
 Pt called and would like one more day on his work excuse, note printed and pt will pick up

## 2024-05-28 NOTE — Progress Notes (Signed)
 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

## 2024-07-05 ENCOUNTER — Other Ambulatory Visit: Payer: Self-pay

## 2024-07-05 ENCOUNTER — Emergency Department (HOSPITAL_BASED_OUTPATIENT_CLINIC_OR_DEPARTMENT_OTHER)

## 2024-07-05 ENCOUNTER — Emergency Department (HOSPITAL_BASED_OUTPATIENT_CLINIC_OR_DEPARTMENT_OTHER): Admission: EM | Admit: 2024-07-05 | Discharge: 2024-07-05 | Disposition: A | Discharge status: Home / Self Care

## 2024-07-05 ENCOUNTER — Encounter (HOSPITAL_BASED_OUTPATIENT_CLINIC_OR_DEPARTMENT_OTHER): Payer: Self-pay

## 2024-07-05 DIAGNOSIS — W25XXXA Contact with sharp glass, initial encounter: Secondary | ICD-10-CM | POA: Insufficient documentation

## 2024-07-05 DIAGNOSIS — S91312A Laceration without foreign body, left foot, initial encounter: Secondary | ICD-10-CM | POA: Insufficient documentation

## 2024-07-05 DIAGNOSIS — Y9301 Activity, walking, marching and hiking: Secondary | ICD-10-CM | POA: Diagnosis not present

## 2024-07-05 DIAGNOSIS — S91311A Laceration without foreign body, right foot, initial encounter: Secondary | ICD-10-CM | POA: Insufficient documentation

## 2024-07-05 DIAGNOSIS — S99921A Unspecified injury of right foot, initial encounter: Secondary | ICD-10-CM | POA: Diagnosis present

## 2024-07-05 MED ORDER — LIDOCAINE HCL (PF) 1 % IJ SOLN
10.0000 mL | Freq: Once | INTRAMUSCULAR | Status: AC
  Administered 2024-07-05: 10 mL
  Filled 2024-07-05: qty 10

## 2024-07-05 NOTE — ED Triage Notes (Signed)
 Pt POV, stepped on piece of glass with L foot, was scratching L foot with R foot and cut the top of R foot, bleeding controlled at this time. Tetanus UTD.

## 2024-07-05 NOTE — Discharge Instructions (Addendum)
 Have sutures removed in 5-7 days.   If you have any kind of redness or discharge from the wound then please come to the ED due to concern for infection.   Make sure you wash your foot multiple times per day.  Both of your feet.  Wash up to 3 times per day with soap and water.  Please monitor the wounds.

## 2024-07-05 NOTE — ED Provider Notes (Signed)
 [place] EMERGENCY DEPARTMENT AT [facility] Provider Note   CSN: [id number] Arrival date & time: 07/05/24  2000     Patient presents with: Laceration   Anthony Leblanc is a 58 y.o. male.   HPI    Patient presents due to laceration.  Patient states that he broke a glass yesterday.  Subsequently was walking in the house today and subsequently cut the bottom of his left foot.  He did take that foot and scraped it along the top of his right foot and cut the dorsum of his right foot as well.  Patient states that he is up-to-date on tetanus.  Has been within the last 10 years.  Has no other complaints.  Previous medical history reviewed : Last seen in the urgent care on 05/26/24. Cough. Negative workup. STD testing negative.   Prior to Admission medications   Medication Sig Start Date End Date Taking? Authorizing Provider  atorvastatin  (LIPITOR) 20 MG tablet Take 20 mg by mouth daily.    [provider]  ibuprofen  (ADVIL ) 800 MG tablet Take 1 tablet (800 mg total) by mouth 3 (three) times daily. 09/06/20   Adah Corning A, FNP  meloxicam (MOBIC) 15 MG tablet Take 15 mg by mouth daily. 05/14/21   [provider]  methylPREDNISolone  (MEDROL  DOSEPAK) 4 MG TBPK tablet Please follow instructions on your Medrol  Dosepak 05/26/24   Jha, Panav, MD  Multiple Vitamin (MULTIVITAMIN) tablet Take 1 tablet by mouth daily.    [provider]  omega-3 acid ethyl esters (LOVAZA) 1 G capsule Take 4 capsules by mouth daily.    [provider]  sildenafil (VIAGRA) 100 MG tablet Take 100 mg by mouth daily as needed for erectile dysfunction.    [provider]    Allergies: Patient has no known allergies.    Review of Systems  Constitutional:  Negative for chills and fever.  HENT:  Negative for ear pain and sore throat.   Eyes:  Negative for pain and visual disturbance.  Respiratory:  Negative for cough and shortness of breath.   Cardiovascular:  Negative for  chest pain and palpitations.  Gastrointestinal:  Negative for abdominal pain and vomiting.  Genitourinary:  Negative for dysuria and hematuria.  Musculoskeletal:  Negative for arthralgias and back pain.  Skin:  Negative for color change and rash.  Neurological:  Negative for seizures and syncope.  All other systems reviewed and are negative.   Updated Vital Signs BP (!) 150/76   Pulse 96   Temp 99.2 F (37.3 C) (Oral)   Resp 18   Ht 6' 2 (1.88 m)   Wt 108.9 kg   SpO2 97%   BMI 30.81 kg/m   Physical Exam Vitals and nursing note reviewed.  Constitutional:      General: He is not in acute distress.    Appearance: He is well-developed.  HENT:     Head: Normocephalic and atraumatic.  Eyes:     Conjunctiva/sclera: Conjunctivae normal.  Cardiovascular:     Rate and Rhythm: Normal rate and regular rhythm.     Heart sounds: No murmur heard. Pulmonary:     Effort: Pulmonary effort is normal. No respiratory distress.     Breath sounds: Normal breath sounds.  Abdominal:     Palpations: Abdomen is soft.     Tenderness: There is no abdominal tenderness.  Musculoskeletal:        General: No swelling.     Cervical back: Neck supple.  Feet:  Skin:    General: Skin is warm and dry.     Capillary Refill: Capillary refill takes less than 2 seconds.  Neurological:     Mental Status: He is alert.  Psychiatric:        Mood and Affect: Mood normal.     (all labs ordered are listed, but only abnormal results are displayed) Labs Reviewed - No data to display  EKG: None  Radiology: DG Foot Complete Right Result Date: 07/05/2024 CLINICAL DATA:  Laceration to the dorsum of the foot. Evaluation for foreign body. Stepped on piece of glass with left foot. Was scratching left foot with right foot and cut the top of right foot. EXAM: RIGHT FOOT COMPLETE - 3+ VIEW; LEFT FOOT - COMPLETE 3+ VIEW COMPARISON:  None Available. FINDINGS: No acute fracture or dislocation in the bilateral  feet. No radiopaque foreign body in either foot. IMPRESSION: No radiopaque foreign body in the bilateral feet. Electronically Signed   By: Norman Gatlin M.D.   On: 07/05/2024 20:43   DG Foot Complete Left Result Date: 07/05/2024 CLINICAL DATA:  Laceration to the dorsum of the foot. Evaluation for foreign body. Stepped on piece of glass with left foot. Was scratching left foot with right foot and cut the top of right foot. EXAM: RIGHT FOOT COMPLETE - 3+ VIEW; LEFT FOOT - COMPLETE 3+ VIEW COMPARISON:  None Available. FINDINGS: No acute fracture or dislocation in the bilateral feet. No radiopaque foreign body in either foot. IMPRESSION: No radiopaque foreign body in the bilateral feet. Electronically Signed   By: Norman Gatlin M.D.   On: 07/05/2024 20:43     .Laceration Repair  Date/Time: 07/05/2024 9:07 PM  Performed by: Simon Lavonia SAILOR, MD Authorized by: Simon Lavonia SAILOR, MD   Consent:    Consent obtained:  Verbal   Consent given by:  Patient   Risks, benefits, and alternatives were discussed: yes     Risks discussed:  Infection, pain, retained foreign body, tendon damage, vascular damage, poor wound healing, poor cosmetic result, need for additional repair and nerve damage   Alternatives discussed:  Observation Universal protocol:    Patient identity confirmed:  Verbally with patient Anesthesia:    Anesthesia method:  Local infiltration   Local anesthetic:  Lidocaine  1% w/o epi Laceration details:    Location:  Foot   Foot location:  Top of R foot   Length (cm):  3.8 Pre-procedure details:    Preparation:  Patient was prepped and draped in usual sterile fashion and imaging obtained to evaluate for foreign bodies Exploration:    Hemostasis achieved with:  Direct pressure   Imaging obtained: x-ray     Imaging outcome: foreign body not noted     Wound exploration: wound explored through full range of motion and entire depth of wound visualized     Contaminated: no   Treatment:     Area cleansed with:  Povidone-iodine and saline   Amount of cleaning:  Extensive   Irrigation solution:  Sterile saline   Irrigation volume:  1000   Irrigation method:  Pressure wash   Debridement:  None   Undermining:  None   Scar revision: no   Skin repair:    Repair method:  Sutures   Suture size:  4-0   Suture material:  Prolene   Suture technique:  Simple interrupted   Number of sutures:  4 Approximation:    Approximation:  Close Repair type:    Repair type:  Simple Post-procedure details:    Dressing:  Non-adherent dressing    Medications Ordered in the ED  lidocaine  (PF) (XYLOCAINE ) 1 % injection 10 mL (10 mLs Other Given 07/05/24 2014)                                    Medical Decision Making Amount and/or Complexity of Data Reviewed Radiology: ordered.  Risk Prescription drug management.  .     Patient presents due to laceration.  Patient states that he broke a glass yesterday.  Subsequently was walking in the house today and subsequently cut the bottom of his left foot.  He did take that foot and scraped it along the top of his right foot and cut the dorsum of his right foot as well.  Patient states that he is up-to-date on tetanus.  Has been within the last 10 years.  Has no other complaints.   Previous medical history reviewed : Last seen in the urgent care on 05/26/24. Cough. Negative workup. STD testing negative.   Upon exam, patient hemodynamically stable.   Up-to-date on tetanus.  No indication for Tdap.   Laceration dorsum of the right foot.  Laceration along the calcaneus of the left foot.   X-ray obtained to any kind of foreign body.  No FB was seen.  Was able to explore all the different cuts to the base of the wound.  No foreign body was seen as well.  Large amounts of irrigation at all laceration sites to make sure there is no small retained piece of glass.  Subsequently sutured the right dorsal aspect of the foot.  Noted 1.5 inch  laceration.  4 sutures were placed.  He will have these removed in around 7 days  Patient does have a small laceration to the bottom of his left foot along his calcaneus.  Does not need closure.  Explained to him that needs to wash his area at least 2-3 times daily to help prevent infection.         Final diagnoses:  Laceration of right foot, initial encounter  Laceration of left foot, initial encounter    ED Discharge Orders     None          Simon Lavonia SAILOR, MD 07/05/24 2112

## 2024-07-12 ENCOUNTER — Emergency Department (HOSPITAL_BASED_OUTPATIENT_CLINIC_OR_DEPARTMENT_OTHER)
Admission: EM | Admit: 2024-07-12 | Discharge: 2024-07-12 | Disposition: A | Discharge status: Home / Self Care | Attending: Emergency Medicine | Admitting: Emergency Medicine

## 2024-07-12 DIAGNOSIS — L989 Disorder of the skin and subcutaneous tissue, unspecified: Secondary | ICD-10-CM | POA: Insufficient documentation

## 2024-07-12 DIAGNOSIS — Z4802 Encounter for removal of sutures: Secondary | ICD-10-CM | POA: Insufficient documentation

## 2024-07-12 DIAGNOSIS — R21 Rash and other nonspecific skin eruption: Secondary | ICD-10-CM

## 2024-07-12 LAB — HIV ANTIBODY (ROUTINE TESTING W REFLEX): HIV Screen 4th Generation wRfx: NONREACTIVE

## 2024-07-12 NOTE — Discharge Instructions (Signed)
 Your sutures removed in the ED.  Recommend routine follow-up with primary care.  Return if you develop any new or worsening symptoms.

## 2024-07-12 NOTE — ED Triage Notes (Signed)
 States has bumps around genital area and wants STD testing

## 2024-07-12 NOTE — ED Provider Notes (Signed)
 [place] EMERGENCY DEPARTMENT AT [facility] Provider Note   CSN: [id number] Arrival date & time: 07/12/24  1354     Patient presents with: Suture / Staple Removal and SEXUALLY TRANSMITTED DISEASE   Anthony Leblanc is a 58 y.o. male.    Suture / Staple Removal   58 year old male presents emergency department with request of suture removal.  States that he suffered laceration on Saturday to the top of his right foot.  Laceration occurred when it was cut with a piece of glass.  Patient denies any wound complications.  Has been performing wound care at home.  Presents to have the sutures removed.  Denies any fevers, chills.  Patient also reporting to lesions in his genital area.  States that he used his daughter's old razor to shave his pubic hair and the areas became present after.  States that he is also sexually often and concerned about possible STD.  Denies any urinary symptoms, penile discharge, testicular pain.  Past medical history significant for obesity, hypercholesterolemia  Prior to Admission medications   Medication Sig Start Date End Date Taking? Authorizing Provider  atorvastatin  (LIPITOR) 20 MG tablet Take 20 mg by mouth daily.    [provider]  ibuprofen  (ADVIL ) 800 MG tablet Take 1 tablet (800 mg total) by mouth 3 (three) times daily. 09/06/20   Adah Corning A, FNP  meloxicam (MOBIC) 15 MG tablet Take 15 mg by mouth daily. 05/14/21   [provider]  methylPREDNISolone  (MEDROL  DOSEPAK) 4 MG TBPK tablet Please follow instructions on your Medrol  Dosepak 05/26/24   Jha, Panav, MD  Multiple Vitamin (MULTIVITAMIN) tablet Take 1 tablet by mouth daily.    [provider]  omega-3 acid ethyl esters (LOVAZA) 1 G capsule Take 4 capsules by mouth daily.    [provider]  sildenafil (VIAGRA) 100 MG tablet Take 100 mg by mouth daily as needed for erectile dysfunction.    [provider]    Allergies: Patient has no known  allergies.    Review of Systems  All other systems reviewed and are negative.   Updated Vital Signs BP (!) 145/85 (BP Location: Right Arm)   Pulse (!) 109   Temp 99.7 F (37.6 C) (Oral)   Resp 18   Ht 6' 2 (1.88 m)   Wt 108.8 kg   SpO2 95%   BMI 30.80 kg/m   Physical Exam Vitals and nursing note reviewed.  Constitutional:      General: He is not in acute distress.    Appearance: He is well-developed.  HENT:     Head: Normocephalic and atraumatic.  Eyes:     Conjunctiva/sclera: Conjunctivae normal.  Cardiovascular:     Rate and Rhythm: Normal rate and regular rhythm.     Heart sounds: No murmur heard. Pulmonary:     Effort: Pulmonary effort is normal. No respiratory distress.     Breath sounds: Normal breath sounds.  Abdominal:     Palpations: Abdomen is soft.     Tenderness: There is no abdominal tenderness.  Genitourinary:      Comments: 2 raised nodular areas measuring a few millimeters in diameter as above.  No obvious ulcerations present.  No obvious palpable fluctuance or extending erythema/induration. Musculoskeletal:        General: No swelling.     Cervical back: Neck supple.     Comments: Full range of motion right ankle with digits of right foot.  Prior laceration well-approximated without erythema, palpable fluctuance/induration  or appreciable purulent drainage.  4 Prolene sutures in place.  Pedal and posterior tibial pulses 2+ bilaterally.  Skin:    General: Skin is warm and dry.     Capillary Refill: Capillary refill takes less than 2 seconds.  Neurological:     Mental Status: He is alert.  Psychiatric:        Mood and Affect: Mood normal.     (all labs ordered are listed, but only abnormal results are displayed) Labs Reviewed - No data to display  EKG: None  Radiology: No results found.   Suture Removal  Date/Time: 07/12/2024 3:14 PM  Performed by: Silver Wonda LABOR, PA Authorized by: Silver Wonda LABOR, PA   Consent:    Consent  obtained:  Verbal   Consent given by:  Patient   Risks, benefits, and alternatives were discussed: yes     Risks discussed:  Bleeding, pain and wound separation   Alternatives discussed:  Delayed treatment, no treatment and alternative treatment Universal protocol:    Procedure explained and questions answered to patient or proxy's satisfaction: yes     Relevant documents present and verified: yes     Patient identity confirmed:  Verbally with patient Location:    Location:  Lower extremity   Lower extremity location:  Foot   Foot location:  R foot Procedure details:    Wound appearance:  No signs of infection, clean and good wound healing   Number of sutures removed:  4 Post-procedure details:    Post-removal:  No dressing applied   Procedure completion:  Tolerated well, no immediate complications    Medications Ordered in the ED - No data to display                                  Medical Decision Making  This patient presents to the ED for concern of suture removal, rash, this involves an extensive number of treatment options, and is a complaint that carries with it a high risk of complications and morbidity.  The differential diagnosis includes cellulitis, or Supples, necrotizing infection, dehiscence, cellulitis, gonorrhea/committee a, HIV, other   Co morbidities that complicate the patient evaluation  See HPI   Additional history obtained:  Additional history obtained from EMR External records from outside source obtained and reviewed including hospital records   Lab Tests:  I Ordered, and personally interpreted labs.  The pertinent results include: HIV, RPR, gonorrhea/chlamydia which are pending   Imaging Studies ordered:  N/a   Cardiac Monitoring: / EKG:  N/a   Consultations Obtained:  N/a   Problem List / ED Course / Critical interventions / Medication management  Suture removal, rash Reevaluation of the patient showed that the patient stayed  the same I have reviewed the patients home medicines and have made adjustments as needed   Social Determinants of Health:  Denies tobacco, licit drug use.   Test / Admission - Considered:  Suture removal, rash Vitals signs significant for hypertension. Otherwise within normal range and stable throughout visit. Laboratory studies significant for: See above 58 year old male presents emergency department with request of suture removal.  States that he suffered laceration on Saturday to the top of his right foot.  Laceration occurred when it was cut with a piece of glass.  Patient denies any wound complications.  Has been performing wound care at home.  Presents to have the sutures removed.  Denies any fevers, chills.  Patient also reporting to lesions in his genital area.  States that he used his daughter's old razor to shave his pubic hair and the areas became present after.  States that he is also sexually often and concerned about possible STD.  Denies any urinary symptoms, penile discharge, testicular pain. Regarding prior right lower extremity wound, well-approximated without overlying skin changes concerning for secondary infectious process.  Sutures removed in manner as above.  Regarding rash, 2 nodular areas appreciated in genital area as above.  No obvious cellulitis, abscess present.  Suspect small ingrown hair given appearance as well as history of rash beginning after shaving the area but unable to express any drainage at this time.  Will recommend warm compresses at home.  Also concerned potentially for STD per patient's history.  Will obtain STD testing and recommend patient follow-up MyChart follow-up with primary care.  Treatment plan discussed with patient and he acknowledged understanding was agreeable to said plan.  Patient well-appearing, afebrile in no acute distress. Worrisome signs and symptoms were discussed with the patient, and the patient acknowledged understanding to return  to the ED if noticed. Patient was stable upon discharge.       Final diagnoses:  Visit for suture removal    ED Discharge Orders     None          Silver Wonda LABOR, [place] 07/12/24 1535    Towana Ozell BROCKS, MD 07/23/24 [phone]

## 2024-07-12 NOTE — ED Triage Notes (Signed)
 Present for suture removal on top of right foot.  Sutured on last Saturday

## 2024-07-13 LAB — RPR: RPR Ser Ql: NONREACTIVE

## 2024-07-14 LAB — GC/CHLAMYDIA PROBE AMP (~~LOC~~) NOT AT ARMC
Chlamydia: NEGATIVE
Comment: NEGATIVE
Comment: NORMAL
Neisseria Gonorrhea: NEGATIVE

## 2024-09-08 ENCOUNTER — Encounter (HOSPITAL_COMMUNITY): Payer: Self-pay | Admitting: *Deleted

## 2024-09-08 ENCOUNTER — Ambulatory Visit (HOSPITAL_COMMUNITY): Admission: EM | Admit: 2024-09-08 | Discharge: 2024-09-08 | Disposition: A | Discharge status: Home / Self Care

## 2024-09-08 ENCOUNTER — Other Ambulatory Visit: Payer: Self-pay

## 2024-09-08 DIAGNOSIS — D170 Benign lipomatous neoplasm of skin and subcutaneous tissue of head, face and neck: Secondary | ICD-10-CM | POA: Diagnosis not present

## 2024-09-08 NOTE — ED Triage Notes (Signed)
 PT reports he hit the back of head on Wednesday night where he has a lipoma on his scalp. Pt reports he has felt lightheaded  since he hit his head. Pt reports he can not get 2 occurrences at work.  Pt reports he has to see a Engineer, petroleum for removal.

## 2024-09-08 NOTE — ED Provider Notes (Signed)
 [facility]    CSN: [id number] Arrival date & time: 09/08/24  0909      History   Chief Complaint Chief Complaint  Patient presents with   lump on back of head    HPI Anthony Leblanc is a 58 y.o. male.   HPI  Patient is a 58 year old male who presents today complaining of a bruise over his posterior scalp where he previously has an old lipoma.  He is waiting to get this lipoma surgically excised by plastic surgery in upcoming weeks.  Says after bruising his lipoma he developed some subsequent dizziness.  Dizziness was transient and he denies any other focal deficits at that time.  Denies any weakness in his upper or lower extremities.  Denies any numbness tingling in his upper or lower extremities.  Denies any persistent dizziness.  Denies any orthostasis symptoms.  Denies any worsening headache.  Denies any changes to vision.  Says he only needs a work note today because his boss is strict about missing time.  Not requesting any other treatment for his mild head bruise.  Past Medical History:  Diagnosis Date   Disturbance of skin sensation    Family history of diabetes mellitus    Hypersomnia, unspecified    Obesity, unspecified    Pain in joint, lower leg    Patella-femoral syndrome   Pure hypercholesterolemia     There are no active problems to display for this patient.   Past Surgical History:  Procedure Laterality Date   HEMORRHOID SURGERY         Home Medications    Prior to Admission medications   Medication Sig Start Date End Date Taking? Authorizing Provider  atorvastatin  (LIPITOR) 20 MG tablet Take 20 mg by mouth daily.   Yes [provider]  meloxicam (MOBIC) 15 MG tablet Take 15 mg by mouth daily. 05/14/21  Yes [provider]  Multiple Vitamin (MULTIVITAMIN) tablet Take 1 tablet by mouth daily.   Yes [provider]  omega-3 acid ethyl esters (LOVAZA) 1 G capsule Take 4 capsules by mouth daily.   Yes [provider]  sildenafil (VIAGRA) 100 MG tablet Take 100 mg by mouth daily as needed for erectile dysfunction.   Yes [provider]  ibuprofen  (ADVIL ) 800 MG tablet Take 1 tablet (800 mg total) by mouth 3 (three) times daily. 09/06/20   Adah Wilbert DELENA, FNP  methylPREDNISolone  (MEDROL  DOSEPAK) 4 MG TBPK tablet Please follow instructions on your Medrol  Dosepak 05/26/24   Vita Morrow, MD    Family History Family History  Problem Relation Age of Onset   Cancer Father    Diabetes Mother     Social History Social History   Tobacco Use   Smoking status: Never   Smokeless tobacco: Never  Vaping Use   Vaping status: Never Used  Substance Use Topics   Alcohol use: Yes    Alcohol/week: 4.0 standard drinks of alcohol    Types: 4 Standard drinks or equivalent per week   Drug use: Yes    Types: Cocaine     Allergies   Patient has no known allergies.   Review of Systems Review of Systems  Constitutional:        Negative except as noted above.  All other systems reviewed and are negative.    Physical Exam Triage Vital Signs ED Triage Vitals  Encounter Vitals Group     BP 09/08/24 1002 129/81     Girls Systolic BP Percentile --  Girls Diastolic BP Percentile --      Boys Systolic BP Percentile --      Boys Diastolic BP Percentile --      Pulse Rate 09/08/24 1002 64     Resp 09/08/24 1002 20     Temp 09/08/24 1002 98.4 F (36.9 C)     Temp src --      SpO2 09/08/24 1002 97 %     Weight --      Height --      Head Circumference --      Peak Flow --      Pain Score 09/08/24 1000 5     Pain Loc --      Pain Education --      Exclude from Growth Chart --    No data found.  Updated Vital Signs BP 129/81   Pulse 64   Temp 98.4 F (36.9 C)   Resp 20   SpO2 97%   Visual Acuity Right Eye Distance:   Left Eye Distance:   Bilateral Distance:    Right Eye Near:   Left Eye Near:    Bilateral Near:     Physical Exam Vitals and nursing note reviewed.   Constitutional:      General: He is not in acute distress.    Appearance: He is well-developed.  HENT:     Head: Normocephalic and atraumatic.  Eyes:     General: No visual field deficit.    Conjunctiva/sclera: Conjunctivae normal.  Cardiovascular:     Rate and Rhythm: Normal rate and regular rhythm.     Pulses: Normal pulses.     Heart sounds: Normal heart sounds. No murmur heard. Pulmonary:     Effort: Pulmonary effort is normal. No respiratory distress.     Breath sounds: Normal breath sounds.  Abdominal:     Palpations: Abdomen is soft.     Tenderness: There is no abdominal tenderness.  Musculoskeletal:        General: No swelling.     Cervical back: Neck supple.  Skin:    General: Skin is warm and dry.     Capillary Refill: Capillary refill takes less than 2 seconds.         Comments: Lipoma measuring roughly 6 cm x 6 mm over posterior superior scalp.  No evidence of significant trauma.  No ecchymoses, no erythema.  Minimal TTP.  Neurological:     Mental Status: He is alert and oriented to person, place, and time. Mental status is at baseline.     Cranial Nerves: Cranial nerves 2-12 are intact. No cranial nerve deficit or facial asymmetry.     Sensory: Sensation is intact.     Motor: Motor function is intact.     Coordination: Coordination is intact. Romberg sign negative. Coordination normal.     Gait: Gait is intact.  Psychiatric:        Mood and Affect: Mood normal.      UC Treatments / Results  Labs (all labs ordered are listed, but only abnormal results are displayed) Labs Reviewed - No data to display  EKG   Radiology No results found.  Procedures Procedures (including critical care time)  Medications Ordered in UC Medications - No data to display  Initial Impression / Assessment and Plan / UC Course  I have reviewed the triage vital signs and the nursing notes.  Pertinent labs & imaging results that were available during my care of the patient  were reviewed  by me and considered in my medical decision making (see chart for details).    #Bruising of lipoma on posterior superior scalp -Patient had episode of transient dizziness after lightly bumping his head over lipoma while watching TV.  Dizziness has since improved.  Requesting work note for missing today. -Full neurologic exam completed no concern for CVA.  No focal deficits present on exam - Provided patient with 1 day work note, should be able to return to work tomorrow as long as symptoms stay resolved. - Patient has to follow-up with plastic surgery for removal of posterior scalp lipoma. - Reviewed ED precautions.  No further questions or concerns at this time from patient.  Final Clinical Impressions(s) / UC Diagnoses   Final diagnoses:  Lipoma of head   Discharge Instructions   None    ED Prescriptions   None    PDMP not reviewed this encounter.   Lynwood Barter, DO 09/08/24 1058

## 2024-11-10 ENCOUNTER — Encounter (HOSPITAL_COMMUNITY): Payer: Self-pay

## 2024-11-10 ENCOUNTER — Ambulatory Visit (HOSPITAL_COMMUNITY)
Admission: EM | Admit: 2024-11-10 | Discharge: 2024-11-10 | Disposition: A | Discharge status: Home / Self Care | Payer: Self-pay | Attending: Emergency Medicine | Admitting: Emergency Medicine

## 2024-11-10 DIAGNOSIS — N489 Disorder of penis, unspecified: Secondary | ICD-10-CM | POA: Insufficient documentation

## 2024-11-10 DIAGNOSIS — N4830 Priapism, unspecified: Secondary | ICD-10-CM | POA: Insufficient documentation

## 2024-11-10 DIAGNOSIS — R369 Urethral discharge, unspecified: Secondary | ICD-10-CM | POA: Insufficient documentation

## 2024-11-10 NOTE — ED Provider Notes (Signed)
 [facility]    CSN: [id number] Arrival date & time: 11/10/24  1011      History   Chief Complaint Chief Complaint  Patient presents with   SEXUALLY TRANSMITTED DISEASE    HPI QUINCY BOY is a 58 y.o. male.   Patient presents with intermittent abnormal penile discharge x 2 days. Patient also reports noticing pain with erection that began yesterday, denies persistent erection. Denies dysuria, hematuria, urinary frequency/urgency, testicular pain, persistent penile pain, abdominal pain, and fever.  Patient reports that he is sexually active and did have unprotected sex about 1 week ago.  Denies any known exposures to STDs.  The history is provided by the patient and medical records.    Past Medical History:  Diagnosis Date   Disturbance of skin sensation    Family history of diabetes mellitus    Hypersomnia, unspecified    Obesity, unspecified    Pain in joint, lower leg    Patella-femoral syndrome   Pure hypercholesterolemia     There are no active problems to display for this patient.   Past Surgical History:  Procedure Laterality Date   HEMORRHOID SURGERY         Home Medications    Prior to Admission medications   Medication Sig Start Date End Date Taking? Authorizing Provider  atorvastatin  (LIPITOR) 20 MG tablet Take 20 mg by mouth daily.    [provider]  meloxicam (MOBIC) 7.5 MG tablet Take 7.5 mg by mouth daily.    [provider]  Multiple Vitamin (MULTIVITAMIN) tablet Take 1 tablet by mouth daily.    [provider]  omega-3 acid ethyl esters (LOVAZA) 1 G capsule Take 4 capsules by mouth daily.    [provider]  sildenafil (VIAGRA) 100 MG tablet Take 100 mg by mouth daily as needed for erectile dysfunction.    [provider]    Family History Family History  Problem Relation Age of Onset   Cancer Father    Diabetes Mother     Social History Social History   Tobacco Use   Smoking  status: Never   Smokeless tobacco: Never  Vaping Use   Vaping status: Never Used  Substance Use Topics   Alcohol use: Yes    Alcohol/week: 4.0 standard drinks of alcohol    Types: 4 Standard drinks or equivalent per week   Drug use: Not Currently    Types: Cocaine     Allergies   Patient has no known allergies.   Review of Systems Review of Systems  Per HPI   Physical Exam Triage Vital Signs ED Triage Vitals  Encounter Vitals Group     BP 11/10/24 1114 109/70     Girls Systolic BP Percentile --      Girls Diastolic BP Percentile --      Boys Systolic BP Percentile --      Boys Diastolic BP Percentile --      Pulse Rate 11/10/24 1114 65     Resp 11/10/24 1114 16     Temp 11/10/24 1114 98.3 F (36.8 C)     Temp Source 11/10/24 1114 Oral     SpO2 11/10/24 1114 98 %     Weight --      Height --      Head Circumference --      Peak Flow --      Pain Score 11/10/24 1110 5     Pain Loc --  Pain Education --      Exclude from Growth Chart --    No data found.  Updated Vital Signs BP 109/70 (BP Location: Left Arm)   Pulse 65   Temp 98.3 F (36.8 C) (Oral)   Resp 16   SpO2 98%   Visual Acuity Right Eye Distance:   Left Eye Distance:   Bilateral Distance:    Right Eye Near:   Left Eye Near:    Bilateral Near:     Physical Exam Vitals and nursing note reviewed. Exam conducted with a chaperone present Dairl RN present for exam).  Constitutional:      General: He is awake.     Appearance: Normal appearance. He is well-developed and well-groomed.  Genitourinary:    Penis: Circumcised. Lesions present. No erythema, tenderness, discharge or swelling.      Testes: Normal.        Right: Mass, tenderness or swelling not present.        Left: Mass, tenderness or swelling not present.     Epididymis:     Right: Normal.     Left: Normal.       Comments: Single small open lesion noted to the dorsal aspect of the penile shaft. Skin:    General: Skin is  warm and dry.  Neurological:     Mental Status: He is alert.  Psychiatric:        Behavior: Behavior is cooperative.      UC Treatments / Results  Labs (all labs ordered are listed, but only abnormal results are displayed) Labs Reviewed  HIV ANTIBODY (ROUTINE TESTING W REFLEX)  RPR  HSV 1/2 PCR (SURFACE)  CYTOLOGY, (ORAL, ANAL, URETHRAL) ANCILLARY ONLY    EKG   Radiology No results found.  Procedures Procedures (including critical care time)  Medications Ordered in UC Medications - No data to display  Initial Impression / Assessment and Plan / UC Course  I have reviewed the triage vital signs and the nursing notes.  Pertinent labs & imaging results that were available during my care of the patient were reviewed by me and considered in my medical decision making (see chart for details).     Patient is overall well-appearing.  Vitals are stable.  There is a single small open lesion noted to the dorsal aspect of the penile shaft.  Did swab this for HSV but does not appear to be suspicious for this so therefore deferring empirical treatment at this time.  Patient performed cytology swab for STD prior to GU exam.  HIV and RPR ordered.  Discussed follow-up and return precautions. Final Clinical Impressions(s) / UC Diagnoses   Final diagnoses:  Penile discharge  Painful penile erection  Penile lesion     Discharge Instructions      Your results will come back over the next few days and someone will call if results are positive and require treatment.   You can follow-up with [facility] if your symptoms continue for further evaluation if your symptoms continue. Otherwise follow-up with your primary care provider or return here as needed.    ED Prescriptions   None    PDMP not reviewed this encounter.   Johnie Flaming A, NP 11/10/24 1145

## 2024-11-10 NOTE — Discharge Instructions (Signed)
 Your results will come back over the next few days and someone will call if results are positive and require treatment.   You can follow-up with [facility] if your symptoms continue for further evaluation if your symptoms continue. Otherwise follow-up with your primary care provider or return here as needed.

## 2024-11-10 NOTE — ED Triage Notes (Signed)
 Patient here today with c/o pain in an erection last night and has also noticed some penile discharge X 2 days.

## 2024-11-11 LAB — RPR: RPR Ser Ql: NONREACTIVE

## 2024-11-11 LAB — HSV 1/2 PCR (SURFACE)
HSV-1 DNA: NOT DETECTED
HSV-2 DNA: DETECTED — AB

## 2024-11-11 LAB — CYTOLOGY, (ORAL, ANAL, URETHRAL) ANCILLARY ONLY
Chlamydia: NEGATIVE
Comment: NEGATIVE
Comment: NEGATIVE
Comment: NORMAL
Neisseria Gonorrhea: NEGATIVE
Trichomonas: NEGATIVE

## 2024-11-11 LAB — MISC LABCORP TEST (SEND OUT): Labcorp test code: 83935

## 2024-11-12 ENCOUNTER — Ambulatory Visit (HOSPITAL_COMMUNITY): Payer: Self-pay

## 2024-11-12 MED ORDER — ACYCLOVIR 400 MG PO TABS: 400.0000 mg | ORAL_TABLET | Freq: Three times a day (TID) | ORAL | 0 refills | Status: AC

## 2024-12-16 ENCOUNTER — Ambulatory Visit (HOSPITAL_COMMUNITY)
Admission: EM | Admit: 2024-12-16 | Discharge: 2024-12-17 | Disposition: A | Discharge status: Home / Self Care | Attending: Psychiatry | Admitting: Psychiatry

## 2024-12-16 DIAGNOSIS — F1410 Cocaine abuse, uncomplicated: Secondary | ICD-10-CM | POA: Diagnosis not present

## 2024-12-16 DIAGNOSIS — F1020 Alcohol dependence, uncomplicated: Secondary | ICD-10-CM | POA: Insufficient documentation

## 2024-12-16 DIAGNOSIS — Z560 Unemployment, unspecified: Secondary | ICD-10-CM | POA: Insufficient documentation

## 2024-12-16 LAB — LIPID PANEL
Cholesterol: 165 mg/dL (ref 0–200)
HDL: 49 mg/dL
LDL Cholesterol: 98 mg/dL (ref 0–99)
Total CHOL/HDL Ratio: 3.3 ratio
Triglycerides: 90 mg/dL
VLDL: 18 mg/dL (ref 0–40)

## 2024-12-16 LAB — COMPREHENSIVE METABOLIC PANEL WITH GFR
ALT: 18 U/L (ref 0–44)
AST: 26 U/L (ref 15–41)
Albumin: 4.3 g/dL (ref 3.5–5.0)
Alkaline Phosphatase: 79 U/L (ref 38–126)
Anion gap: 14 (ref 5–15)
BUN: 11 mg/dL (ref 6–20)
CO2: 21 mmol/L — ABNORMAL LOW (ref 22–32)
Calcium: 9.5 mg/dL (ref 8.9–10.3)
Chloride: 106 mmol/L (ref 98–111)
Creatinine, Ser: 1.12 mg/dL (ref 0.61–1.24)
GFR, Estimated: >60 mL/min
Glucose, Bld: 72 mg/dL (ref 70–99)
Potassium: 4.4 mmol/L (ref 3.5–5.1)
Sodium: 141 mmol/L (ref 135–145)
Total Bilirubin: 0.6 mg/dL (ref 0.0–1.2)
Total Protein: 7.5 g/dL (ref 6.5–8.1)

## 2024-12-16 LAB — URINALYSIS, ROUTINE W REFLEX MICROSCOPIC
Bilirubin Urine: NEGATIVE
Glucose, UA: NEGATIVE mg/dL
Hgb urine dipstick: NEGATIVE
Ketones, ur: NEGATIVE mg/dL
Leukocytes,Ua: NEGATIVE
Nitrite: NEGATIVE
Protein, ur: NEGATIVE mg/dL
Specific Gravity, Urine: 1.02 (ref 1.005–1.030)
pH: 7 (ref 5.0–8.0)

## 2024-12-16 LAB — CBC WITH DIFFERENTIAL/PLATELET
Abs Immature Granulocytes: 0.01 K/uL (ref 0.00–0.07)
Basophils Absolute: 0 K/uL (ref 0.0–0.1)
Basophils Relative: 0 %
Eosinophils Absolute: 0.1 K/uL (ref 0.0–0.5)
Eosinophils Relative: 2 %
HCT: 44.7 % (ref 39.0–52.0)
Hemoglobin: 14.8 g/dL (ref 13.0–17.0)
Immature Granulocytes: 0 %
Lymphocytes Relative: 34 %
Lymphs Abs: 1.7 K/uL (ref 0.7–4.0)
MCH: 29.3 pg (ref 26.0–34.0)
MCHC: 33.1 g/dL (ref 30.0–36.0)
MCV: 88.5 fL (ref 80.0–100.0)
Monocytes Absolute: 0.4 K/uL (ref 0.1–1.0)
Monocytes Relative: 9 %
Neutro Abs: 2.7 K/uL (ref 1.7–7.7)
Neutrophils Relative %: 55 %
Platelets: 207 K/uL (ref 150–400)
RBC: 5.05 MIL/uL (ref 4.22–5.81)
RDW: 12.6 % (ref 11.5–15.5)
WBC: 4.8 K/uL (ref 4.0–10.5)
nRBC: 0 % (ref 0.0–0.2)

## 2024-12-16 LAB — POCT URINE DRUG SCREEN - MANUAL ENTRY (I-SCREEN)
POC Amphetamine UR: NOT DETECTED
POC Buprenorphine (BUP): NOT DETECTED
POC Cocaine UR: POSITIVE — AB
POC Marijuana UR: POSITIVE — AB
POC Methadone UR: NOT DETECTED
POC Methamphetamine UR: NOT DETECTED
POC Morphine: NOT DETECTED
POC Oxazepam (BZO): NOT DETECTED
POC Oxycodone UR: NOT DETECTED
POC Secobarbital (BAR): NOT DETECTED

## 2024-12-16 LAB — MAGNESIUM: Magnesium: 2.5 mg/dL — ABNORMAL HIGH (ref 1.7–2.4)

## 2024-12-16 LAB — HIV ANTIBODY (ROUTINE TESTING W REFLEX): HIV Screen 4th Generation wRfx: NONREACTIVE

## 2024-12-16 LAB — TSH: TSH: 0.855 u[IU]/mL (ref 0.350–4.500)

## 2024-12-16 LAB — ETHANOL: Alcohol, Ethyl (B): <15 mg/dL

## 2024-12-16 MED ORDER — MAGNESIUM HYDROXIDE 400 MG/5ML PO SUSP: 30.0000 mL | Freq: Every day | ORAL | Status: DC | PRN

## 2024-12-16 MED ORDER — ALUM & MAG HYDROXIDE-SIMETH 200-200-20 MG/5ML PO SUSP: 30.0000 mL | ORAL | Status: DC | PRN

## 2024-12-16 MED ORDER — OLANZAPINE 5 MG PO TBDP: 5.0000 mg | ORAL_TABLET | Freq: Three times a day (TID) | ORAL | Status: DC | PRN

## 2024-12-16 MED ORDER — OLANZAPINE 10 MG IM SOLR: 5.0000 mg | Freq: Three times a day (TID) | INTRAMUSCULAR | Status: DC | PRN

## 2024-12-16 MED ORDER — ACETAMINOPHEN 325 MG PO TABS: 650.0000 mg | ORAL_TABLET | Freq: Four times a day (QID) | ORAL | Status: DC | PRN

## 2024-12-16 NOTE — ED Notes (Signed)
 Pt is currently sleeping, no distress noted, environmental check complete, will continue to monitor patient for safety.

## 2024-12-16 NOTE — Progress Notes (Signed)
" °   12/16/24 1808  [facility] Triage Screening (Walk-ins at [facility] only)  How Did You Hear About Us ? Self  What Is the Reason for Your Visit/Call Today? Anthony Leblanc is a 58Y male presenting to [facility] as a vol walk-in. Pt states he is here today looking for detox and help with a 50B against his roomate. Pt states this all started when he lost his job and in result started using substances. Pt was offered another job and lost that one also due to failing a drug test. Pt states he drinks everyday and uses cocaine everyday. Pt denies SI and AVH. Pt endorses HI towards the person living in his home who is making him feel unsafe. Pt also used cocaine today and drank a couple of 40s.  How Long Has This Been Causing You Problems? > than 6 months  Have You Recently Had Any Thoughts About Hurting Yourself? No  Are You Planning to Commit Suicide/Harm Yourself At This time? No  Have you Recently Had Thoughts About Hurting Someone Sherral? Yes  How long ago did you have thoughts of harming others? Today  Are You Planning To Harm Someone At This Time? Yes  Explanation: roomate  Physical Abuse Denies  Verbal Abuse Denies  Sexual Abuse Denies  Exploitation of patient/patient's resources Yes, present (Comment)  Are you currently experiencing any auditory, visual or other hallucinations? No  Have You Used Any Alcohol or Drugs in the Past 24 Hours? Yes  What Did You Use and How Much? Cocaine & Alcohol  Do you have any current medical co-morbidities that require immediate attention? No  What Do You Feel Would Help You the Most Today? Alcohol or Drug Use Treatment;Support for unsafe relationship  Determination of Need Routine (7 days)  Options For Referral Facility-Based Crisis;[facility]    "

## 2024-12-16 NOTE — BH Assessment (Signed)
 Comprehensive Clinical Assessment (CCA) Note  12/16/2024 BLAIKE VICKERS [id number]  Disposition: Starlyn Patron, NP recommends pt to be admitted to Facility Based Crisis unit.   The patient demonstrates the following risk factors for suicide: Chronic risk factors for suicide include: substance use disorder. Acute risk factors for suicide include: Pt denies, SI. Protective factors for this patient include: positive social support and Pt denies, SI. Considering these factors, the overall suicide risk at this point appears to be no risk. Patient is not appropriate for outpatient follow up.  TILLMON KISLING is a 58 year old male who presents voluntary and unaccompanied to [facility] ([facility] Clinician asked the pt, what brought you to the hospital? Pt reports, he wants to detox, he lost his job and daily Alcohol and Crack Cocaine use. Pt reports, around 12/01/2024 he invited a guy (he's a drug dealer) and a lady friend (the guy introduced him to) live in his home. Pt reports, his lady friend has her three kids living in the home. Pt reports, the guy is trying to take over his home, he has an extra copy of keys to his house and truck. Pt reports, he wants to take a 50B out on the guy and the lady and her kids out of his home Pt reports, he lost his job in October after his coworker showed HR an inappropriate text message he sent. Pt reports, the coworker initiated the contact however he deleted the images and text messages. Pt reports, he feels they can make him go away and find another [employer]. Pt reports, he feel bullied by the guy, he will protect himself if there's conflict. Pt reports, his lady friend knows his SSN, address because she helped him complete his unemployment paperwork. Pt denies, SI, HI, hallucinations, self-injurious behaviors.   Pt reports, using not much (a dime bag of) Crack Cocaine around 0600. Pt denies, being linked to OPT resources  (medication management and/or counseling.)   Pt presents alert in casual attire with normal speech. Pt's mood was euthymic. Pt's affect was congruent. Pt's insight was fair. Pt's judgement was poor.   Chief Complaint:  Chief Complaint  Patient presents with   SEXUALLY TRANSMITTED DISEASE   Visit Diagnosis: Cocaine use Disorder, severe.    CCA Screening, Triage and Referral (STR)  Patient Reported Information How did you hear about us ? Self  What Is the Reason for Your Visit/Call Today? From traige note: Saman Umstead is a 58Y male presenting to [facility] as a vol walk-in. Pt states he is here today looking for detox and help with a 50B against his roomate. Pt states this all started when he lost his job and in result started using substances. Pt was offered another job and lost that one also due to failing a drug test. Pt states he drinks everyday and uses cocaine everyday. Pt denies SI and AVH. Pt endorses HI towards the person living in his home who is making him feel unsafe. Pt also used cocaine today and drank a couple of 40s.  How Long Has This Been Causing You Problems? > than 6 months  What Do You Feel Would Help You the Most Today? Alcohol or Drug Use Treatment; Stress Management; Financial Resources; Treatment for Depression or other mood problem   Have You Recently Had Any Thoughts About Hurting Yourself? No  Are You Planning to Commit Suicide/Harm Yourself At This time? No   Flowsheet Row UC from 11/10/2024 in [facility]  at [facility] UC from 09/08/2024 in [facility] at [facility] ED from 07/12/2024 in [facility] Emergency Department at [facility]  C-SSRS RISK CATEGORY No Risk No Risk No Risk    Have you Recently Had Thoughts About Hurting Someone Sherral? No  Are You Planning to Harm Someone at This Time? No  Explanation: Pt reports, he has a house guest that will not leave and if there is resistance it may become physical. Pt also reports, he  feels they (house guests) can replace him as they have his information (SSN, address, house and truck keys).   Have You Used Any Alcohol or Drugs in the Past 24 Hours? Yes  How Long Ago Did You Use Drugs or Alcohol? Pt reports, using around 0600.  What Did You Use and How Much? Pt reports, using not much (a dime bag of) Crack Cocaine around 0600.   Do You Currently Have a Therapist/Psychiatrist? No  Name of Therapist/Psychiatrist:    Have You Been Recently Discharged From Any Office Practice or Programs? No  Explanation of Discharge From Practice/Program: NA    CCA Screening Triage Referral Assessment Type of Contact: Face-to-Face  Telemedicine Service Delivery:   Is this Initial or Reassessment?   Date Telepsych consult ordered in [facility]:    Time Telepsych consult ordered in [facility]:    Location of Assessment: [facility] Assessment Services  Provider Location: [facility] Assessment Services   Collateral Involvement: None.   Does Patient Have a Automotive Engineer Guardian? No  Legal Guardian Contact Information: NA  Copy of Legal Guardianship Form: -- (NA)  Legal Guardian Notified of Arrival: -- (NA)  Legal Guardian Notified of Pending Discharge: -- (NA)  If Minor and Not Living with Parent(s), Who has Custody? NA  Is CPS involved or ever been involved? Never  Is APS involved or ever been involved? Never   Patient Determined To Be At Risk for Harm To Self or Others Based on Review of Patient Reported Information or Presenting Complaint? No  Method: No Plan  Availability of Means: No access or NA  Intent: Vague intent or NA  Notification Required: No need or identified person  Additional Information for Danger to Others Potential: -- (NA)  Additional Comments for Danger to Others Potential: NA  Are There Guns or Other Weapons in Your Home?  Types of Guns/Weapons:  Are These Weapons Safely Secured?                            -- (NA)  Who Could Verify You Are  Able To Have These Secured: NA  Do You Have any Outstanding Charges, Pending Court Dates, Parole/Probation? Pt denies.  Contacted To Inform of Risk of Harm To Self or Others: Other: Comment (NA)    Does Patient Present under Involuntary Commitment? No    [place] of Residence: [place]   Patient Currently Receiving the Following Services: Not Receiving Services   Determination of Need: Urgent (48 hours)   Options For Referral: Facility-Based Crisis; Outpatient Therapy; Medication Management     CCA Biopsychosocial Patient Reported Schizophrenia/Schizoaffective Diagnosis in Past: No   Strengths: Pt is seeking help and wanting gainful employment.   Mental Health Symptoms Depression:  Fatigue; Sleep (too much or little)   Duration of Depressive symptoms: Duration of Depressive Symptoms: Greater than two weeks   Mania:  None   Anxiety:   Worrying; Fatigue   Psychosis:  None   Duration  of Psychotic symptoms:    Trauma:  None   Obsessions:  None   Compulsions:  None   Inattention:  None   Hyperactivity/Impulsivity:  None   Oppositional/Defiant Behaviors:  Angry   Emotional Irregularity:  None   Other Mood/Personality Symptoms:  NA    Mental Status Exam Appearance and self-care  Stature:  Average   Weight:  Average weight   Clothing:  Casual   Grooming:  Normal   Cosmetic use:  None   Posture/gait:  Normal   Motor activity:  Not Remarkable   Sensorium  Attention:  Confused   Concentration:  Normal   Orientation:  X5   Recall/memory:  Normal   Affect and Mood  Affect:  Constricted   Mood:  Euthymic   Relating  Eye contact:  Normal   Facial expression:  Responsive   Attitude toward examiner:  Cooperative   Thought and Language  Speech flow: Normal   Thought content:  Appropriate to Mood and Circumstances   Preoccupation:  None   Hallucinations:  None   Organization:  Coherent   [employer] of Knowledge:   Fair   Intelligence:  Average   Abstraction:  Normal   Judgement:  Poor   Reality Testing:  Adequate   Insight:  Fair   Decision Making:  Impulsive   Social Functioning  Social Maturity:  Impulsive   Social Judgement:  Chief Of Staff; Victimized   Stress  Stressors:  Other (Comment); Housing (Pt reports, people living in his house that he does not want there.)   Coping Ability:  Overwhelmed   Skill Deficits:  Decision making   Supports:  Family     Religion: Religion/Spirituality Are You A Religious Person?:  (Pt reports, he's spiritual.) How Might This Affect Treatment?: NA  Leisure/Recreation: Leisure / Recreation Do You Have Hobbies?: Yes Leisure and Hobbies: Pt reports, he likes to work out.  Exercise/Diet: Exercise/Diet Do You Exercise?: Yes What Type of Exercise Do You Do?: Other (Comment) (Push ups, sit ups, surls, using dumbells.) Have You Gained or Lost A Significant Amount of Weight in the Past Six Months?: No Do You Follow a Special Diet?: No Do You Have Any Trouble Sleeping?: Yes Explanation of Sleeping Difficulties: Pt reports, gettintg little sleep.   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 Pt reports, he was also at a [employer] in [place]  on partial [employer]. Did You Have An Individualized Education Program (IIEP): No Did You Have Any Difficulty At School?: No Patient's Education Has Been Impacted by Current Illness: No   CCA Family/Childhood History Family and Relationship  History: Family history Marital status: Separated Separated, when?: Pt reports, two years ago. What types of issues is patient dealing with in the relationship?: Pt reports, his marriage sucked, it was for the kids, he worked all the time. Additional relationship information: NA Does patient have children?: Yes How many children?: 2 How is patient's relationship with their children?: Pt reports, they get along.  Childhood History:  Childhood History By whom was/is the patient raised?: Both parents Did patient suffer any verbal/emotional/physical/sexual abuse as a child?: No  Did patient suffer from severe childhood neglect?: No Has patient ever been sexually abused/assaulted/raped as an adolescent or adult?: No Was the patient ever a victim of a crime or a disaster?: No Witnessed domestic violence?: No Has patient been affected by domestic violence as an adult?: No  CCA Substance Use Alcohol/Drug Use: Alcohol / Drug Use Pain Medications: See MAR Prescriptions: See MAR Over the Counter: See MAR History of alcohol / drug use?: Yes Longest period of sobriety (when/how long): Pt reports, a year and six months. Negative Consequences of Use: Personal relationships, Financial Withdrawal Symptoms: None Substance #1 Name of Substance 1: Crack Cocaine. 1 - Age of First Use: Since 1990, he was introduced to Crack Cocaine by a supervisor at that time. 1 - Amount (size/oz): Pt reports, a dime bag. 1 - Frequency: Daily. 1 - Duration: Ongoing. 1 - Last Use / Amount: Around 0600. 1 - Method of Aquiring: Purchase. 1- Route of Use: Smoke.    ASAM's:  Six Dimensions of Multidimensional Assessment  Dimension 1:  Acute Intoxication and/or Withdrawal Potential:   Dimension 1:  Description of individual's past and current experiences of substance use and withdrawal: None.  Dimension 2:  Biomedical Conditions and Complications:   Dimension 2:  Description of patient's biomedical conditions and   complications: None.  Dimension 3:  Emotional, Behavioral, or Cognitive Conditions and Complications:  Dimension 3:  Description of emotional, behavioral, or cognitive conditions and complications: Pt has some depression, anxiety symptoms.  Dimension 4:  Readiness to Change:  Dimension 4:  Description of Readiness to Change criteria: Pt reports, he wants to detox.  Dimension 5:  Relapse, Continued use, or Continued Problem Potential:  Dimension 5:  Relapse, continued use, or continued problem potential critiera description: Pt has ongoing use.  Dimension 6:  Recovery/Living Environment:  Dimension 6:  Recovery/Iiving environment criteria description: Pt has a higher education careers adviser, a lady friend and her three kids living in his home. Pt reports, he wants them gone, he wants to puta 50B out on the drug dealer (he's trying to take over his home).  ASAM Severity Score: ASAM's Severity Rating Score: 7  ASAM Recommended Level of Treatment: ASAM Recommended Level of Treatment: Level II Intensive Outpatient Treatment   Substance use Disorder (SUD) Substance Use Disorder (SUD)  Checklist Symptoms of Substance Use: Continued use despite having a persistent/recurrent physical/psychological problem caused/exacerbated by use, Continued use despite persistent or recurrent social, interpersonal problems, caused or exacerbated by use  Recommendations for Services/Supports/Treatments: Recommendations for Services/Supports/Treatments Recommendations For Services/Supports/Treatments: Facility Based Crisis  Disposition Recommendation per psychiatric provider: Pt to be admitted to Facility Based Crisis unit.    DSM5 Diagnoses: There are no active problems to display for this patient.    Referrals to Alternative Service(s): Referred to Alternative Service(s):   Place:   Date:   Time:    Referred to Alternative Service(s):   Place:   Date:   Time:    Referred to Alternative Service(s):   Place:   Date:   Time:     Referred to Alternative Service(s):   Place:   Date:   Time:     Jackson JONETTA Broach, [facility] Comprehensive Clinical Assessment (CCA) Screening, Triage and Referral Note  12/16/2024 KYROS SALZWEDEL [id number]  Chief Complaint:  Chief Complaint  Patient presents with   SEXUALLY TRANSMITTED DISEASE   Visit Diagnosis:   Patient Reported Information How did you hear about us ? Self  What Is the Reason for Your Visit/Call Today? From  traige note: Rasaan Brotherton is a 58Y male presenting to [facility] as a vol walk-in. Pt states he is here today looking for detox and help with a 50B against his roomate. Pt states this all started when he lost his job and in result started using substances. Pt was offered another job and lost that one also due to failing a drug test. Pt states he drinks everyday and uses cocaine everyday. Pt denies SI and AVH. Pt endorses HI towards the person living in his home who is making him feel unsafe. Pt also used cocaine today and drank a couple of 40s.  How Long Has This Been Causing You Problems? > than 6 months  What Do You Feel Would Help You the Most Today? Alcohol or Drug Use Treatment; Stress Management; Financial Resources; Treatment for Depression or other mood problem   Have You Recently Had Any Thoughts About Hurting Yourself? No  Are You Planning to Commit Suicide/Harm Yourself At This time? No   Have you Recently Had Thoughts About Hurting Someone Sherral? No  Are You Planning to Harm Someone at This Time? No  Explanation: Pt reports, he has a house guest that will not leave and if there is resistance it may become physical. Pt also reports, he feels they (house guests) can replace him as they have his information (SSN, address, house and truck keys).   Have You Used Any Alcohol or Drugs in the Past 24 Hours? Yes  How Long Ago Did You Use Drugs or Alcohol? Pt reports, using around 0600.  What Did You Use and How Much? Pt reports, using not much (a dime bag  of) Crack Cocaine around 0600.   Do You Currently Have a Therapist/Psychiatrist? No  Name of Therapist/Psychiatrist: Pt denies, being linked to OPT resources (medication management and/or counseling.)   Have You Been Recently Discharged From Any Office Practice or Programs? No  Explanation of Discharge From Practice/Program: NA   CCA Screening Triage Referral Assessment Type of Contact: Face-to-Face  Telemedicine Service Delivery:   Is this Initial or Reassessment?   Date Telepsych consult ordered in [facility]:    Time Telepsych consult ordered in [facility]:    Location of Assessment: [facility] Assessment Services  Provider Location: [facility] Assessment Services    Collateral Involvement: None.   Does Patient Have a Automotive Engineer Guardian? No. Name and Contact of Legal Guardian: NA If Minor and Not Living with Parent(s), Who has Custody? NA  Is CPS involved or ever been involved? Never  Is APS involved or ever been involved? Never   Patient Determined To Be At Risk for Harm To Self or Others Based on Review of Patient Reported Information or Presenting Complaint? No  Method: No Plan  Availability of Means: No access or NA  Intent: Vague intent or NA  Notification Required: No need or identified person  Additional Information for Danger to Others Potential: -- (NA)  Additional Comments for Danger to Others Potential: NA  Are There Guns or Other Weapons in Your Home?  Types of Guns/Weapons: Are These Weapons Safely Secured?                            -- (NA)  Who Could Verify You Are Able To Have These Secured: NA  Do You Have any Outstanding Charges, Pending Court Dates, Parole/Probation? Pt denies.  Contacted To Inform of Risk of Harm To Self or Others: Other: Comment (NA)  Does Patient Present under Involuntary Commitment? No    [place] of Residence: [place]   Patient Currently Receiving the Following Services: Not Receiving Services   Determination of  Need: Urgent (48 hours)   Options For Referral: Facility-Based Crisis; Outpatient Therapy; Medication Management   Disposition Recommendation per psychiatric provider: Pt to be admitted to Facility Based Crisis unit.   Jackson JONETTA Broach, LCMHC     Maricruz Lucero D Tiffini Blacksher, MS, [place] [facility], [facility] Triage Specialist [phone]

## 2024-12-16 NOTE — ED Notes (Signed)
 Patient admission assessment has been completed, patient presents with complaint of needing detox from crack cocaine. Patient disclosed during his skin check that he got mixed up with the wrong people and he has been using drugs. This nurse provided reassurance to patient that we are here to help him. Patient seems in good spirits and ready to start his journey to recovery. Patient was brought on unit, familiarized with unit, food was given , patient is now lying on recliner with eyes closed.
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
## 2024-12-17 ENCOUNTER — Other Ambulatory Visit (HOSPITAL_COMMUNITY)
Admission: EM | Admit: 2024-12-17 | Discharge: 2024-12-19 | Disposition: A | Discharge status: Home / Self Care | Source: Intra-hospital | Attending: Psychiatry | Admitting: Psychiatry

## 2024-12-17 DIAGNOSIS — M79675 Pain in left toe(s): Secondary | ICD-10-CM | POA: Diagnosis not present

## 2024-12-17 DIAGNOSIS — F1020 Alcohol dependence, uncomplicated: Secondary | ICD-10-CM | POA: Diagnosis not present

## 2024-12-17 DIAGNOSIS — Z658 Other specified problems related to psychosocial circumstances: Secondary | ICD-10-CM | POA: Diagnosis not present

## 2024-12-17 DIAGNOSIS — M10079 Idiopathic gout, unspecified ankle and foot: Secondary | ICD-10-CM | POA: Diagnosis not present

## 2024-12-17 DIAGNOSIS — F1410 Cocaine abuse, uncomplicated: Secondary | ICD-10-CM | POA: Insufficient documentation

## 2024-12-17 DIAGNOSIS — F419 Anxiety disorder, unspecified: Secondary | ICD-10-CM | POA: Insufficient documentation

## 2024-12-17 DIAGNOSIS — Z59869 Financial insecurity, unspecified: Secondary | ICD-10-CM | POA: Diagnosis not present

## 2024-12-17 DIAGNOSIS — F1910 Other psychoactive substance abuse, uncomplicated: Secondary | ICD-10-CM

## 2024-12-17 LAB — HEMOGLOBIN A1C
Hgb A1c MFr Bld: 5.5 % (ref 4.8–5.6)
Mean Plasma Glucose: 111.15 mg/dL

## 2024-12-17 MED ORDER — THIAMINE HCL 100 MG/ML IJ SOLN
100.0000 mg | Freq: Once | INTRAMUSCULAR | Status: AC
  Administered 2024-12-17: 100 mg via INTRAMUSCULAR
  Filled 2024-12-17: qty 2

## 2024-12-17 MED ORDER — HYDROXYZINE HCL 25 MG PO TABS
25.0000 mg | ORAL_TABLET | Freq: Four times a day (QID) | ORAL | Status: DC | PRN
  Filled 2024-12-17: qty 1

## 2024-12-17 MED ORDER — ONDANSETRON 4 MG PO TBDP: 4.0000 mg | ORAL_TABLET | Freq: Four times a day (QID) | ORAL | Status: DC | PRN

## 2024-12-17 MED ORDER — CHLORDIAZEPOXIDE HCL 25 MG PO CAPS: 25.0000 mg | ORAL_CAPSULE | Freq: Four times a day (QID) | ORAL | Status: DC | PRN

## 2024-12-17 MED ORDER — DICYCLOMINE HCL 20 MG PO TABS: 20.0000 mg | ORAL_TABLET | Freq: Four times a day (QID) | ORAL | Status: DC | PRN

## 2024-12-17 MED ORDER — ADULT MULTIVITAMIN W/MINERALS CH
1.0000 | ORAL_TABLET | Freq: Every day | ORAL | Status: DC
  Administered 2024-12-17 – 2024-12-19 (×3): 1 via ORAL
  Filled 2024-12-17 (×3): qty 1

## 2024-12-17 MED ORDER — LOPERAMIDE HCL 2 MG PO CAPS: 2.0000 mg | ORAL_CAPSULE | ORAL | Status: DC | PRN

## 2024-12-17 MED ORDER — ALUM & MAG HYDROXIDE-SIMETH 200-200-20 MG/5ML PO SUSP: 30.0000 mL | ORAL | Status: DC | PRN

## 2024-12-17 MED ORDER — NAPROXEN 500 MG PO TABS
500.0000 mg | ORAL_TABLET | Freq: Two times a day (BID) | ORAL | Status: DC | PRN
  Administered 2024-12-17: 500 mg via ORAL
  Filled 2024-12-17: qty 1

## 2024-12-17 MED ORDER — METHOCARBAMOL 500 MG PO TABS: 500.0000 mg | ORAL_TABLET | Freq: Three times a day (TID) | ORAL | Status: DC | PRN

## 2024-12-17 MED ORDER — OLANZAPINE 5 MG PO TBDP: 5.0000 mg | ORAL_TABLET | Freq: Three times a day (TID) | ORAL | Status: DC | PRN

## 2024-12-17 MED ORDER — OLANZAPINE 10 MG IM SOLR: 5.0000 mg | Freq: Three times a day (TID) | INTRAMUSCULAR | Status: DC | PRN

## 2024-12-17 MED ORDER — MAGNESIUM HYDROXIDE 400 MG/5ML PO SUSP: 30.0000 mL | Freq: Every day | ORAL | Status: DC | PRN

## 2024-12-17 NOTE — Group Note (Signed)
 Group Topic: Relapse and Recovery  Group Date: 12/17/2024 Start Time: 1100 End Time: 1200 Facilitators: Bryston Colocho, LCSW  Department: [facility]  Number of Participants: 5  Group Focus: abuse issues, chemical dependency education, chemical dependency issues, communication, relapse prevention, and substance abuse education Treatment Modality:  Cognitive Behavioral Therapy Interventions utilized were group exercise and support Purpose: enhance coping skills, relapse prevention strategies, and trigger / craving management   Writer met with the group to discuss holidays and recovery.  Writer utilized a CBT activity to discuss substance use and how and what triggers cause relapse during this time of the year.  All group members stated they would rather be at home but this is the place where they need to be.  Writer also explored with each client how they want Christmas to look for them next year. Name: Anthony Leblanc Date of Birth: 08-16-1966  MR: [id number]    Level of Participation: moderate and engaged.  Client fell asleep once. Quality of Participation: engaged  Interactions with others: gave feedback Mood/Affect: appropriate and brightens with interaction Triggers (if applicable): current housing situation and financial concerns.  Cognition: goal directed and logical Progress: Moderate Response: Client was open and gave supportive feedback.  Plan: follow-up as needed  Patients Problems:  Patient Active Problem List   Diagnosis Date Noted   Alcohol use disorder, severe, dependence (HCC) 12/17/2024
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
## 2024-12-17 NOTE — Group Note (Signed)
 Group Topic: Wellness  Group Date: 12/17/2024 Start Time: 1200 End Time: 1230 Facilitators: Daved Tinnie HERO, RN  Department: [facility]  Number of Participants: 7  Group Focus: check in Treatment Modality:  Psychoeducation Interventions utilized were patient education Purpose: increase insight  Name: Anthony Leblanc Date of Birth: Nov 17, 1966  MR: [id number]    Level of Participation: moderate Quality of Participation: cooperative Interactions with others: gave feedback Mood/Affect: appropriate Triggers (if applicable): n/a Cognition: coherent/clear Progress: Gaining insight Response: RN reviewed pt medications, pt verbalized understanding  Plan: patient will be encouraged to attend future RN education groups and discuss medications as needed.   Patients Problems:  Patient Active Problem List   Diagnosis Date Noted   Alcohol use disorder, severe, dependence (HCC) 12/17/2024

## 2024-12-17 NOTE — ED Notes (Signed)
 No concerns or complaints reported to RN this shift. No distress reported or observed.

## 2024-12-17 NOTE — ED Provider Notes (Signed)
 Facility Based Crisis Admission H&P  Date: 12/17/2024 Patient Name: Anthony Leblanc MRN: [id number]  Chief Complaint: substance abuse  Diagnoses:  Final diagnoses:  Polysubstance abuse (HCC)  Alcohol use disorder, severe, dependence (HCC)  Cocaine use disorder [facility])   Mr. Ozga is a 58yo M with a psych h/o alcohol use disorder, cocaine use disorder, who was admitted to [facility] unit for substance use disorder treatment.   HPI:  The patient reports that a lot of things have happened recently. His primary stressor was losing his job at a [employer] company after sending an inappropriate text to a coworker while intoxicated. Following the job loss, he describes a progressive downward spiral characterized by increased stress, mild depressive symptoms, escalation in alcohol use, and relapse on cocaine. Due to financial strain and inability to keep up with mortgage payments, he accepted roommates in an attempt to generate income; however, these individuals were actively using substances, took advantage of him, and contributed to further destabilization. He reports minimal social support, is separated from his wife, and has adult children. The patient states that he wants to get back on track. He denies feeling clinically depressed, describing his experience instead as feeling overwhelmed, stressed, and pulled into a maladaptive cycle of substance use in response to recent losses. He denies suicidal or homicidal ideation. He endorses anxiety about the future and is actively seeking help for substance use treatment. His longest period of sobriety in the past was approximately 1.5 years; he has participated in multiple rehabilitation programs previously. At this time, he expresses interest in residential treatment but is conflicted, as he fears that prolonged absence from work may result in losing his home. He is currently considering treatment options. He denies symptoms of mania or psychosis. There is no history  of suicide attempts or inpatient psychiatric hospitalizations; his prior admissions have been for substance use rehabilitation only.  He denies current physical complaints and reports no withdrawal symptoms at this time.  ED Workup: UDS pos for cocaine, THC. BAL < 15. CBC - wnl. CMP - mostly wnl. EKG - NSR with Qtc .   PHQ 2-9:  Flowsheet Row ED from 12/17/2024 in [facility] ED from 12/16/2024 in [facility]  Thoughts that you would be better off dead, or of hurting yourself in some way Not at all Not at all  PHQ-9 Total Score 8 7    Flowsheet Row ED from 12/17/2024 in [facility] ED from 12/16/2024 in [facility] UC from 11/10/2024 in [facility] RISK CATEGORY No Risk No Risk No Risk    Screenings    Flowsheet Row Most Recent Value  CIWA-Ar Total 0  COWS Total Score 0    Total Time spent with patient: 45 minutes  Musculoskeletal  Strength & Muscle Tone: within normal limits Gait & Station: normal Patient leans: N/A  Psychiatric Specialty Exam  Presentation General Appearance:  Appropriate for Environment  Eye Contact: Fair  Speech: Clear and Coherent  Speech Volume: Normal  Handedness: Right   Mood and Affect  Mood: Euthymic  Affect: Appropriate   Thought Process  Thought Processes: Goal Directed; Coherent  Descriptions of Associations:Intact  Orientation:Full (Time, Place and Person)  Thought Content:WDL  Diagnosis of Schizophrenia or Schizoaffective disorder in past: No   Hallucinations:Hallucinations: None  Ideas of Reference:None  Suicidal Thoughts:Suicidal Thoughts: No  Homicidal Thoughts:Homicidal Thoughts: No   Sensorium  Memory: Immediate  Fair; Recent Fair; Remote Fair  Judgment: Fair  Insight: Fair   Chartered Certified Accountant: Fair  Attention  Span: Fair  Recall: [employer] of Knowledge: Fair  Language: Fair   Psychomotor Activity  Psychomotor Activity:No data recorded  Assets  Assets: Communication Skills; Desire for Improvement   Sleep  Sleep: Sleep: Fair   Nutritional Assessment (For OBS and FBC admissions only) Has the patient had a weight loss or gain of 10 pounds or more in the last 3 months?: No Has the patient had a decrease in food intake/or appetite?: No Does the patient have dental problems?: No Does the patient have eating habits or behaviors that may be indicators of an eating disorder including binging or inducing vomiting?: No Has the patient recently lost weight without trying?: 0 Has the patient been eating poorly because of a decreased appetite?: 0 Malnutrition Screening Tool Score: 0    Physical Exam Constitutional:      Appearance: Normal appearance.  HENT:     Head: Normocephalic and atraumatic.  Eyes:     Extraocular Movements: Extraocular movements intact.     Pupils: Pupils are equal, round, and reactive to light.  Cardiovascular:     Rate and Rhythm: Normal rate and regular rhythm.  Pulmonary:     Effort: Pulmonary effort is normal.  Abdominal:     General: Abdomen is flat.  Musculoskeletal:        General: Normal range of motion.     Cervical back: Normal range of motion.  Skin:    General: Skin is warm and dry.  Neurological:     General: No focal deficit present.     Mental Status: He is alert and oriented to person, place, and time. Mental status is at baseline.  Psychiatric:        Mood and Affect: Mood normal.        Behavior: Behavior normal.        Thought Content: Thought content normal.        Judgment: Judgment normal.    Review of Systems  Constitutional:  Negative for chills and fever.  HENT:  Negative for hearing loss.   Eyes:  Negative for blurred vision.  Respiratory:  Negative for cough.   Cardiovascular:  Negative for chest pain.   Gastrointestinal:  Negative for nausea and vomiting.  Neurological:  Negative for tremors, seizures and headaches.  Psychiatric/Behavioral:  Positive for substance abuse. Negative for depression, hallucinations, memory loss and suicidal ideas. The patient is nervous/anxious. The patient does not have insomnia.     Blood pressure 128/80, pulse 75, temperature 98.2 F (36.8 C), temperature source Oral, resp. rate 18, SpO2 100%. There is no height or weight on file to calculate BMI.  Past Psychiatric History:  The patient denies any history of inpatient psychiatric hospitalizations or suicide attempts. He reports multiple prior admissions to substance use rehabilitation programs. His longest period of sobriety was approximately 1.5 years. He denies a history of manic or psychotic symptoms. There is no reported history of self-harm behaviors. He is currently seeking treatment for substance use and has no active psychiatric complaints outside of stress and substance-related issues.  Past Medical History: HLD  Family History: unremarkable  Social History: lives alone, separated from wife, grown children, unemployed. No access to guns. Has been spending about 20$-100$ a day on alcohol and cocaine I spend as much as I can afford. Patient reports he started using alcohol at age 5, cocaine in his 56s.  Last Labs:  Admission on 12/16/2024, Discharged on 12/17/2024  Component Date Value Ref Range Status   WBC 12/16/2024 4.8  4.0 - 10.5 K/uL Final   RBC 12/16/2024 5.05  4.22 - 5.81 MIL/uL Final   Hemoglobin 12/16/2024 14.8  13.0 - 17.0 g/dL Final   HCT 87/76/7974 44.7  39.0 - 52.0 % Final   MCV 12/16/2024 88.5  80.0 - 100.0 fL Final   MCH 12/16/2024 29.3  26.0 - 34.0 pg Final   MCHC 12/16/2024 33.1  30.0 - 36.0 g/dL Final   RDW 87/76/7974 12.6  11.5 - 15.5 % Final   Platelets 12/16/2024 207  150 - 400 K/uL Final   nRBC 12/16/2024 0.0  0.0 - 0.2 % Final   Neutrophils Relative % 12/16/2024 55   % Final   Neutro Abs 12/16/2024 2.7  1.7 - 7.7 K/uL Final   Lymphocytes Relative 12/16/2024 34  % Final   Lymphs Abs 12/16/2024 1.7  0.7 - 4.0 K/uL Final   Monocytes Relative 12/16/2024 9  % Final   Monocytes Absolute 12/16/2024 0.4  0.1 - 1.0 K/uL Final   Eosinophils Relative 12/16/2024 2  % Final   Eosinophils Absolute 12/16/2024 0.1  0.0 - 0.5 K/uL Final   Basophils Relative 12/16/2024 0  % Final   Basophils Absolute 12/16/2024 0.0  0.0 - 0.1 K/uL Final   Immature Granulocytes 12/16/2024 0  % Final   Abs Immature Granulocytes 12/16/2024 0.01  0.00 - 0.07 K/uL Final   Performed at [facility] Lab, [address] [place], [place] [address]   Sodium 12/16/2024 141  135 - 145 mmol/L Final   Potassium 12/16/2024 4.4  3.5 - 5.1 mmol/L Final   Chloride 12/16/2024 106  98 - 111 mmol/L Final   CO2 12/16/2024 21 (L)  22 - 32 mmol/L Final   Glucose, Bld 12/16/2024 72  70 - 99 mg/dL Final   Glucose reference range applies only to samples taken after fasting for at least 8 hours.   BUN 12/16/2024 11  6 - 20 mg/dL Final   Creatinine, Ser 12/16/2024 1.12  0.61 - 1.24 mg/dL Final   Calcium  12/16/2024 9.5  8.9 - 10.3 mg/dL Final   Total Protein 87/76/7974 7.5  6.5 - 8.1 g/dL Final   Albumin 87/76/7974 4.3  3.5 - 5.0 g/dL Final   AST 87/76/7974 26  15 - 41 U/L Final   HEMOLYSIS AT THIS LEVEL MAY AFFECT RESULT   ALT 12/16/2024 18  0 - 44 U/L Final   Alkaline Phosphatase 12/16/2024 79  38 - 126 U/L Final   Total Bilirubin 12/16/2024 0.6  0.0 - 1.2 mg/dL Final   GFR, Estimated 12/16/2024 >60  >60 mL/min Final   Comment: (NOTE) Calculated using the CKD-EPI Creatinine Equation (2021)    Anion gap 12/16/2024 14  5 - 15 Final   Performed at [facility] Lab, [address] [place], [place] [address]   Hgb A1c MFr Bld 12/16/2024 5.5  4.8 - 5.6 % Final   Comment: (NOTE) Diagnosis of Diabetes The following HbA1c ranges recommended by the American Diabetes Association (ADA) may be used as an aid  in the diagnosis of diabetes mellitus.  Hemoglobin             Suggested A1C NGSP%              Diagnosis  <5.7  Non Diabetic  5.7-6.4                Pre-Diabetic  >6.4                   Diabetic  <7.0                   Glycemic control for                       adults with diabetes.     Mean Plasma Glucose 12/16/2024 111.15  mg/dL Final   Performed at [facility] Lab, [address] [place], [place] [address]   Magnesium  12/16/2024 2.5 (H)  1.7 - 2.4 mg/dL Final   Performed at [facility], [address] [place], [place] [address]   Alcohol, Ethyl (B) 12/16/2024 <15  <15 mg/dL Final   Comment: (NOTE) For medical purposes only. Performed at [facility] Lab, [address], [place], [place] [address]    Cholesterol 12/16/2024 165  0 - 200 mg/dL Final   Comment:        ATP III CLASSIFICATION:  <200     mg/dL   Desirable  799-760  mg/dL   Borderline High  >=759    mg/dL   High           Triglycerides 12/16/2024 90  <150 mg/dL Final   HDL 87/76/7974 49  >40 mg/dL Final   Total CHOL/HDL Ratio 12/16/2024 3.3  RATIO Final   VLDL 12/16/2024 18  0 - 40 mg/dL Final   LDL Cholesterol 12/16/2024 98  0 - 99 mg/dL Final   Comment:        Total Cholesterol/HDL:CHD Risk Coronary Heart Disease Risk Table                     Men   Women  1/2 Average Risk   3.4   3.3  Average Risk       5.0   4.4  2 X Average Risk   9.6   7.1  3 X Average Risk  23.4   11.0        Use the calculated Patient Ratio above and the CHD Risk Table to determine the patient's CHD Risk.        ATP III CLASSIFICATION (LDL):  <100     mg/dL   Optimal  899-870  mg/dL   Near or Above                    Optimal  130-159  mg/dL   Borderline  839-810  mg/dL   High  >809     mg/dL   Very High Performed at [facility], [address] [place], [place] [address]    TSH 12/16/2024 0.855  0.350 - 4.500 uIU/mL Final   Performed at [facility], [address],  [place], [place] [address]   Color, Urine 12/16/2024 YELLOW  YELLOW Final   APPearance 12/16/2024 CLEAR  CLEAR Final   Specific Gravity, Urine 12/16/2024 1.020  1.005 - 1.030 Final   pH 12/16/2024 7.0  5.0 - 8.0 Final   Glucose, UA 12/16/2024 NEGATIVE  NEGATIVE mg/dL Final   Hgb urine dipstick 12/16/2024 NEGATIVE  NEGATIVE Final   Bilirubin Urine 12/16/2024 NEGATIVE  NEGATIVE Final   Ketones, ur 12/16/2024 NEGATIVE  NEGATIVE mg/dL Final   Protein, ur 87/76/7974 NEGATIVE  NEGATIVE mg/dL Final  Nitrite 12/16/2024 NEGATIVE  NEGATIVE Final   Leukocytes,Ua 12/16/2024 NEGATIVE  NEGATIVE Final   Performed at [facility], [address] [place], [place] [address]   POC Amphetamine UR 12/16/2024 None Detected  NONE DETECTED (Cut Off Level 1000 ng/mL) Final   POC Secobarbital (BAR) 12/16/2024 None Detected  NONE DETECTED (Cut Off Level 300 ng/mL) Final   POC Buprenorphine (BUP) 12/16/2024 None Detected  NONE DETECTED (Cut Off Level 10 ng/mL) Final   POC Oxazepam (BZO) 12/16/2024 None Detected  NONE DETECTED (Cut Off Level 300 ng/mL) Final   POC Cocaine UR 12/16/2024 Positive (A)  NONE DETECTED (Cut Off Level 300 ng/mL) Final   POC Methamphetamine UR 12/16/2024 None Detected  NONE DETECTED (Cut Off Level 1000 ng/mL) Final   POC Morphine 12/16/2024 None Detected  NONE DETECTED (Cut Off Level 300 ng/mL) Final   POC Methadone UR 12/16/2024 None Detected  NONE DETECTED (Cut Off Level 300 ng/mL) Final   POC Oxycodone UR 12/16/2024 None Detected  NONE DETECTED (Cut Off Level 100 ng/mL) Final   POC Marijuana UR 12/16/2024 Positive (A)  NONE DETECTED (Cut Off Level 50 ng/mL) Final   HIV Screen 4th Generation wRfx 12/16/2024 Non Reactive  Non Reactive Final   Performed at [facility], [address] [place], [place] [address]  Admission on 11/10/2024, Discharged on 11/10/2024  Component Date Value Ref Range Status   RPR Ser Ql 11/10/2024 NON REACTIVE  NON REACTIVE Final   Performed at [facility], [address], [place], [place] [address]   Specimen Source - HSV 11/10/2024 GENITAL   Final   HSV-1 DNA 11/10/2024 NOT DETECTED  NOT DETECTED Final   HSV-2 DNA 11/10/2024 DETECTED (A)  NOT DETECTED Final   Performed at [facility], [address], [place], [place] [address]   Neisseria Gonorrhea 11/10/2024 Negative   Final   Chlamydia 11/10/2024 Negative   Final   Trichomonas 11/10/2024 Negative   Final   Comment 11/10/2024 Normal Reference Range Trichomonas - Negative   Final   Comment 11/10/2024 Normal Reference Ranger Chlamydia - Negative   Final   Comment 11/10/2024 Normal Reference Range Neisseria Gonorrhea - Negative   Final   [facility] test code 11/10/2024 [id number]   Final   [facility] test name 11/10/2024 [id number]   Final   Source ([facility]) 11/10/2024 SERUM   Final   Performed at [facility], [address], [place], [place] [address]   Misc [facility] result 11/10/2024 COMMENT   Final   Comment: (NOTE) Test Ordered: [id number] HIV Ab/p24 Ag with Reflex HIV Ab/p24 Ag Screen           Note:                     BN     Non Reactive                                                  Reference Range: Non Reactive                          HIV-1/HIV-2 antibodies and HIV-1 p24 antigen were NOT detected. There is no laboratory evidence of HIV infection. HIV Negative Performed At: [facility] [address] [place], [place] [id number] Nagendra Sanjai  MD Ey:[id number]   Admission on 07/12/2024, Discharged on 07/12/2024  Component Date Value Ref Range Status   Neisseria Gonorrhea 07/12/2024 Negative   Final   Chlamydia 07/12/2024 Negative   Final   Comment 07/12/2024 Normal Reference Ranger Chlamydia - Negative   Final   Comment 07/12/2024 Normal Reference Range Neisseria Gonorrhea - Negative   Final   RPR Ser Ql 07/12/2024 NON REACTIVE  NON REACTIVE Final   Performed at [facility], [address], [place], [place] [address]   HIV Screen 4th Generation wRfx  07/12/2024 Non Reactive  Non Reactive Final   Performed at [facility] Lab, [address] [place], [place] [address]    Allergies: Patient has no known allergies.  Medications:  Facility Ordered Medications  Medication   alum & mag hydroxide-simeth (MAALOX/MYLANTA) 200-200-20 MG/5ML suspension 30 mL   magnesium  hydroxide (MILK OF MAGNESIA) suspension 30 mL   OLANZapine  zydis (ZYPREXA ) disintegrating tablet 5 mg   OLANZapine  (ZYPREXA ) injection 5 mg   [COMPLETED] thiamine  (VITAMIN B1) injection 100 mg   multivitamin with minerals tablet 1 tablet   chlordiazePOXIDE  (LIBRIUM ) capsule 25 mg   hydrOXYzine  (ATARAX ) tablet 25 mg   loperamide  (IMODIUM ) capsule 2-4 mg   ondansetron  (ZOFRAN -ODT) disintegrating tablet 4 mg   dicyclomine  (BENTYL ) tablet 20 mg   methocarbamol  (ROBAXIN ) tablet 500 mg   naproxen  (NAPROSYN ) tablet 500 mg   PTA Medications  Medication Sig   meloxicam  (MOBIC ) 7.5 MG tablet Take 7.5 mg by mouth daily.   ezetimibe (ZETIA) 10 MG tablet Take 10 mg by mouth daily.   tadalafil (CIALIS) 20 MG tablet Take 20 mg by mouth daily as needed for erectile dysfunction.    Long Term Goals: Improvement in symptoms so as ready for discharge  Short Term Goals: Patient will verbalize feelings in meetings with treatment team members., Patient will attend at least of 50% of the groups daily., Pt will complete the PHQ9 on admission, day 3 and discharge., Patient will participate in completing the [place] Suicide Severity Rating Scale, Patient will score a low risk of violence for 24 hours prior to discharge, and Patient will take medications as prescribed daily.  Medical Decision Making  58yo patient presents with alcohol and cocaine use disorders in the setting of significant recent psychosocial stressors. Given ongoing substance use, impaired coping, limited supports, and risk for further decompensation, continued admission to the [facility] is clinically indicated for stabilization,  monitoring, and coordination of treatment planning.   Plan:  Continue admission to [facility] for substance use stabilization and safety monitoring.  Initiate CIWA protocol with PRN Librium  for management of alcohol withdrawal symptoms.  Psych medications: No scheduled medications ordered. PRN medications for anxiety and insomnia.  Social Work consultation for substance use treatment planning, including discussion of residential treatment options, financial and housing concerns, and coordination of aftercare resources.  Disposition Planning: Ongoing assessment of readiness for step-down care with consideration of patients housing and employment concerns.   Recommendations  Based on my evaluation the patient does not appear to have an emergency medical condition.  Neil Appl, MD 12/17/2024  11:04 AM

## 2024-12-17 NOTE — Care Management (Signed)
 [facility] Care Management...  Writer met with patient to discuss discharge planning  Patient reported stressors at home. 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 Patient reported behind on mortgage payments and considering selling house. Patient reported just losing a position at [employer].Patient reported needing to contact Social Security to call in his weekly unemployment updates. Patient reported driving himself here to [facility].  Writer will retreive his phone from locker so he get SS phone number  Patient reported having a job lined up  Patient reported unsure of his next steps, inpatient or out patient services  Writer advised patient of length of stay here   Writer advised patient that we send out referrals for inpatient treatment and provide resources for out patient   Referrals faxed to [facility], [facility] and

## 2024-12-17 NOTE — ED Notes (Signed)
 Pt observed seated in the community room with peers, socializing appropriately. In no apparent distress. Safety maintained.

## 2024-12-17 NOTE — ED Notes (Addendum)
 58 y/o AAM admitted to [facility] with diagnosis to include Addiction.  Pt presents with a pleasant demeanor, stating that he is depressed because he has home borders who will not leave, loss of a job and that he needs to get his act together Pt stated that he has been in rehab before and relapses due to home stressors and not liking his wife of who is currently separated from. Pt affect is flat, pt is cooperative and was given snacks and hydration after admission on the unit.  Will continue to provide safety checks. Pt is on COWS protocol score=0.

## 2024-12-18 DIAGNOSIS — F1410 Cocaine abuse, uncomplicated: Secondary | ICD-10-CM | POA: Diagnosis not present

## 2024-12-18 DIAGNOSIS — F1020 Alcohol dependence, uncomplicated: Secondary | ICD-10-CM | POA: Diagnosis not present

## 2024-12-18 DIAGNOSIS — M79675 Pain in left toe(s): Secondary | ICD-10-CM | POA: Diagnosis not present

## 2024-12-18 DIAGNOSIS — F419 Anxiety disorder, unspecified: Secondary | ICD-10-CM | POA: Diagnosis not present

## 2024-12-18 MED ORDER — IBUPROFEN 400 MG PO TABS
800.0000 mg | ORAL_TABLET | Freq: Three times a day (TID) | ORAL | Status: AC
  Administered 2024-12-18 (×2): 800 mg via ORAL
  Filled 2024-12-18 (×2): qty 2

## 2024-12-18 NOTE — Group Note (Signed)
 Group Topic: Relapse and Recovery  Group Date: 12/18/2024 Start Time: 8069 End Time: 2000 Facilitators: Verdon Jacqualyn BRAVO, NT  Department: [facility]  Number of Participants: 9  Group Focus: diagnosis education Treatment Modality:  Psychoeducation Interventions utilized were group exercise Purpose: express feelings  Name: Anthony Leblanc Date of Birth: 23-Feb-1966  MR: [id number]    Level of Participation: active Quality of Participation: cooperative Interactions with others: gave feedback Mood/Affect: appropriate Triggers (if applicable): n/a Cognition: coherent/clear Progress: Moderate Response: n/a Plan: follow-up needed  Patients Problems:  Patient Active Problem List   Diagnosis Date Noted   Alcohol use disorder, severe, dependence (HCC) 12/17/2024

## 2024-12-18 NOTE — Group Note (Signed)
 Group Topic: Identity and Relationships  Group Date: 12/18/2024 Start Time: 1115 End Time: 1200 Facilitators: Alyse Leilani LABOR, NT  Department: [facility]  Number of Participants: 9  Group Focus: clarity of thought and co-dependency Treatment Modality:  Psychoeducation Interventions utilized were group exercise, patient education, and problem solving Purpose: enhance coping skills, improve communication skills, and regain self-worth  Name: DUNCAN ALEJANDRO Date of Birth: 06/09/1966  MR: [id number]    Level of Participation: minimal Quality of Participation: drowsy Interactions with others: gave feedback Mood/Affect: restless Triggers (if applicable): none Cognition: coherent/clear Progress: Moderate Response: Listened Plan: patient will be encouraged to keep coming to group.  Patients Problems:  Patient Active Problem List   Diagnosis Date Noted   Alcohol use disorder, severe, dependence (HCC) 12/17/2024

## 2024-12-18 NOTE — Group Note (Signed)
 Group Topic: Relapse and Recovery  Group Date: 12/18/2024 Start Time: 1100 End Time: 1155 Facilitators: Gerome Jolly, NT  Department: [facility]  Number of Participants: 3  Group Focus: coping skills, managing urges, relapse prevention, and substance abuse education Treatment Modality:  Cognitive Behavioral Therapy and Psychoeducation Interventions utilized were exploration and identifying strategies to manage urges Purpose: explore maladaptive thinking, relapse prevention strategies, and trigger / craving management  Name: LORRY ANASTASI Date of Birth: 09-07-1966  MR: [id number]    Level of Participation: active Quality of Participation: attentive, cooperative, and engaged Interactions with others: gave feedback Mood/Affect: appropriate Triggers (if applicable): na Cognition: coherent/clear and goal directed Progress: Gaining insight Response: Pt identified triggers/urges and coping skills to combat those triggers as they arise Plan: follow-up needed  Patients Problems:  Patient Active Problem List   Diagnosis Date Noted   Alcohol use disorder, severe, dependence (HCC) 12/17/2024

## 2024-12-18 NOTE — ED Notes (Signed)
 Patient is in the bedroom calm and sleeping, NAD

## 2024-12-18 NOTE — ED Notes (Signed)
 Patient alert & oriented x4. Denies intent to harm self or others when asked. Denies A/VH. Patient reports pain in L foot related to gout with pain rating at 5.5 (rounded up to 6 for documentation purposes) out of 10. Scheduled medications administered with no complications. No acute distress noted. Support and encouragement provided. Patient observed in milieu. No inappropriate behaviors observed or reported. Routine safety checks conducted per facility protocol. Encouraged patient to notify staff if any thoughts of harm towards self or others arise. Patient verbalizes understanding and agreement.

## 2024-12-18 NOTE — ED Provider Notes (Signed)
 Behavioral Health Progress Note  Date and Time: 12/18/2024 8:56 AM Name: Anthony Leblanc MRN:  [id number]  Anthony Leblanc is a 58yo M with a psych h/o alcohol use disorder, cocaine use disorder, who was admitted to [facility] unit for substance use disorder treatment.   Interval History Patient was seen today for re-evaluation.  Nursing reports no events overnight. The patient has no issues with performing ADLs.  Patient has been medication compliant.    Patient was seen and interviewed by attending psychiatrist. Chart reviewed. Patient discussed during treatment team rounds.  Subjective:  On assessment patient reports feeling good overall, although complaints of acute L toe pain. He reportedly has a history of gout and thinks he is having an acute attack started last night (on exam, L toe swollen, erythematous). Patient reports being in good mood today, denies feeling depressed. He is anxious due to psycho-social stressors. He reports good sleep and appetite. He denies auditory or visual hallucinations, denies feeling paranoid, unsafe, does not express any delusions. He denies thoughts or plans of hurting self or others. He reports no side effects from medications he is getting here. He denies other than related to gout physical complaints. No current symptoms of any substance withdrawal.  Labs: no new results for review.    Diagnosis:  Final diagnoses:  Polysubstance abuse (HCC)  Alcohol use disorder, severe, dependence (HCC)  Cocaine use disorder (HCC)    Total Time spent with patient: 30 minutes  Past Psychiatric History:  The patient denies any history of inpatient psychiatric hospitalizations or suicide attempts. He reports multiple prior admissions to substance use rehabilitation programs. His longest period of sobriety was approximately 1.5 years. He denies a history of manic or psychotic symptoms. There is no reported history of self-harm behaviors. He is currently seeking treatment for  substance use and has no active psychiatric complaints outside of stress and substance-related issues.   Past Medical History: HLD   Family History: unremarkable   Social History: lives alone, separated from wife, grown children, unemployed. No access to guns. Has been spending about 20$-100$ a day on alcohol and cocaine I spend as much as I can afford. Patient reports he started using alcohol at age 13, cocaine in his 24s.   Sleep: Good  Appetite:  Good  Current Medications:  Current Facility-Administered Medications  Medication Dose Route Frequency Provider Last Rate Last Admin   alum & mag hydroxide-simeth (MAALOX/MYLANTA) 200-200-20 MG/5ML suspension 30 mL  30 mL Oral Q4H PRN Onuoha, Chinwendu V, NP       chlordiazePOXIDE  (LIBRIUM ) capsule 25 mg  25 mg Oral Q6H PRN Onuoha, Chinwendu V, NP       dicyclomine  (BENTYL ) tablet 20 mg  20 mg Oral Q6H PRN Onuoha, Chinwendu V, NP       hydrOXYzine  (ATARAX ) tablet 25 mg  25 mg Oral Q6H PRN Onuoha, Chinwendu V, NP       ibuprofen  (ADVIL ) tablet 800 mg  800 mg Oral TID Jenine Krisher, MD       loperamide  (IMODIUM ) capsule 2-4 mg  2-4 mg Oral PRN Onuoha, Chinwendu V, NP       magnesium  hydroxide (MILK OF MAGNESIA) suspension 30 mL  30 mL Oral Daily PRN Onuoha, Chinwendu V, NP       methocarbamol  (ROBAXIN ) tablet 500 mg  500 mg Oral Q8H PRN Onuoha, Chinwendu V, NP       multivitamin with minerals tablet 1 tablet  1 tablet Oral Daily Onuoha, Chinwendu V, NP  1 tablet at 12/17/24 9056   naproxen  (NAPROSYN ) tablet 500 mg  500 mg Oral BID PRN Onuoha, Chinwendu V, NP   500 mg at 12/17/24 9057   OLANZapine  (ZYPREXA ) injection 5 mg  5 mg Intramuscular TID PRN Onuoha, Chinwendu V, NP       OLANZapine  zydis (ZYPREXA ) disintegrating tablet 5 mg  5 mg Oral TID PRN Onuoha, Chinwendu V, NP       ondansetron  (ZOFRAN -ODT) disintegrating tablet 4 mg  4 mg Oral Q6H PRN Onuoha, Chinwendu V, NP       Current Outpatient Medications  Medication Sig Dispense Refill    ezetimibe (ZETIA) 10 MG tablet Take 10 mg by mouth daily.     meloxicam  (MOBIC ) 7.5 MG tablet Take 7.5 mg by mouth daily.     Multiple Vitamin (MULTIVITAMIN WITH MINERALS) TABS tablet Take 1 tablet by mouth daily.     Omega-3 Fatty Acids (FISH OIL) 1000 MG CPDR Take 1,000-3,000 mg by mouth daily.     tadalafil (CIALIS) 20 MG tablet Take 20 mg by mouth daily as needed for erectile dysfunction.      Labs  Lab Results:  Admission on 12/16/2024, Discharged on 12/17/2024  Component Date Value Ref Range Status   WBC 12/16/2024 4.8  4.0 - 10.5 K/uL Final   RBC 12/16/2024 5.05  4.22 - 5.81 MIL/uL Final   Hemoglobin 12/16/2024 14.8  13.0 - 17.0 g/dL Final   HCT 87/76/7974 44.7  39.0 - 52.0 % Final   MCV 12/16/2024 88.5  80.0 - 100.0 fL Final   MCH 12/16/2024 29.3  26.0 - 34.0 pg Final   MCHC 12/16/2024 33.1  30.0 - 36.0 g/dL Final   RDW 87/76/7974 12.6  11.5 - 15.5 % Final   Platelets 12/16/2024 207  150 - 400 K/uL Final   nRBC 12/16/2024 0.0  0.0 - 0.2 % Final   Neutrophils Relative % 12/16/2024 55  % Final   Neutro Abs 12/16/2024 2.7  1.7 - 7.7 K/uL Final   Lymphocytes Relative 12/16/2024 34  % Final   Lymphs Abs 12/16/2024 1.7  0.7 - 4.0 K/uL Final   Monocytes Relative 12/16/2024 9  % Final   Monocytes Absolute 12/16/2024 0.4  0.1 - 1.0 K/uL Final   Eosinophils Relative 12/16/2024 2  % Final   Eosinophils Absolute 12/16/2024 0.1  0.0 - 0.5 K/uL Final   Basophils Relative 12/16/2024 0  % Final   Basophils Absolute 12/16/2024 0.0  0.0 - 0.1 K/uL Final   Immature Granulocytes 12/16/2024 0  % Final   Abs Immature Granulocytes 12/16/2024 0.01  0.00 - 0.07 K/uL Final   Performed at [facility], [address], [place], [place] [address]   Sodium 12/16/2024 141  135 - 145 mmol/L Final   Potassium 12/16/2024 4.4  3.5 - 5.1 mmol/L Final   Chloride 12/16/2024 106  98 - 111 mmol/L Final   CO2 12/16/2024 21 (L)  22 - 32 mmol/L Final   Glucose, Bld 12/16/2024 72  70 - 99 mg/dL Final    Glucose reference range applies only to samples taken after fasting for at least 8 hours.   BUN 12/16/2024 11  6 - 20 mg/dL Final   Creatinine, Ser 12/16/2024 1.12  0.61 - 1.24 mg/dL Final   Calcium  12/16/2024 9.5  8.9 - 10.3 mg/dL Final   Total Protein 87/76/7974 7.5  6.5 - 8.1 g/dL Final   Albumin 87/76/7974 4.3  3.5 - 5.0 g/dL Final   AST  12/16/2024 26  15 - 41 U/L Final   HEMOLYSIS AT THIS LEVEL MAY AFFECT RESULT   ALT 12/16/2024 18  0 - 44 U/L Final   Alkaline Phosphatase 12/16/2024 79  38 - 126 U/L Final   Total Bilirubin 12/16/2024 0.6  0.0 - 1.2 mg/dL Final   GFR, Estimated 12/16/2024 >60  >60 mL/min Final   Comment: (NOTE) Calculated using the CKD-EPI Creatinine Equation (2021)    Anion gap 12/16/2024 14  5 - 15 Final   Performed at [facility], [address] [place], [place] [address]   Hgb A1c MFr Bld 12/16/2024 5.5  4.8 - 5.6 % Final   Comment: (NOTE) Diagnosis of Diabetes The following HbA1c ranges recommended by the American Diabetes Association (ADA) may be used as an aid in the diagnosis of diabetes mellitus.  Hemoglobin             Suggested A1C NGSP%              Diagnosis  <5.7                   Non Diabetic  5.7-6.4                Pre-Diabetic  >6.4                   Diabetic  <7.0                   Glycemic control for                       adults with diabetes.     Mean Plasma Glucose 12/16/2024 111.15  mg/dL Final   Performed at [facility], [address], [place], [place] [address]   Magnesium  12/16/2024 2.5 (H)  1.7 - 2.4 mg/dL Final   Performed at [facility], [address] [place], [place] [address]   Alcohol, Ethyl (B) 12/16/2024 <15  <15 mg/dL Final   Comment: (NOTE) For medical purposes only. Performed at [facility], [address] [place], [place] [address]    Cholesterol 12/16/2024 165  0 - 200 mg/dL Final   Comment:        ATP III CLASSIFICATION:  <200     mg/dL   Desirable  799-760  mg/dL    Borderline High  >=759    mg/dL   High           Triglycerides 12/16/2024 90  <150 mg/dL Final   HDL 87/76/7974 49  >40 mg/dL Final   Total CHOL/HDL Ratio 12/16/2024 3.3  RATIO Final   VLDL 12/16/2024 18  0 - 40 mg/dL Final   LDL Cholesterol 12/16/2024 98  0 - 99 mg/dL Final   Comment:        Total Cholesterol/HDL:CHD Risk Coronary Heart Disease Risk Table                     Men   Women  1/2 Average Risk   3.4   3.3  Average Risk       5.0   4.4  2 X Average Risk   9.6   7.1  3 X Average Risk  23.4   11.0        Use the calculated Patient Ratio above and the CHD Risk Table to determine the patient's CHD Risk.        ATP  III CLASSIFICATION (LDL):  <100     mg/dL   Optimal  899-870  mg/dL   Near or Above                    Optimal  130-159  mg/dL   Borderline  839-810  mg/dL   High  >809     mg/dL   Very High Performed at [facility], [address], [place], [place] [address]    TSH 12/16/2024 0.855  0.350 - 4.500 uIU/mL Final   Performed at [facility], [address] [place], [place] [address]   Color, Urine 12/16/2024 YELLOW  YELLOW Final   APPearance 12/16/2024 CLEAR  CLEAR Final   Specific Gravity, Urine 12/16/2024 1.020  1.005 - 1.030 Final   pH 12/16/2024 7.0  5.0 - 8.0 Final   Glucose, UA 12/16/2024 NEGATIVE  NEGATIVE mg/dL Final   Hgb urine dipstick 12/16/2024 NEGATIVE  NEGATIVE Final   Bilirubin Urine 12/16/2024 NEGATIVE  NEGATIVE Final   Ketones, ur 12/16/2024 NEGATIVE  NEGATIVE mg/dL Final   Protein, ur 87/76/7974 NEGATIVE  NEGATIVE mg/dL Final   Nitrite 87/76/7974 NEGATIVE  NEGATIVE Final   Leukocytes,Ua 12/16/2024 NEGATIVE  NEGATIVE Final   Performed at [facility], [address], [place], [place] [address]   POC Amphetamine UR 12/16/2024 None Detected  NONE DETECTED (Cut Off Level 1000 ng/mL) Final   POC Secobarbital (BAR) 12/16/2024 None Detected  NONE DETECTED (Cut Off Level 300 ng/mL) Final   POC Buprenorphine (BUP) 12/16/2024  None Detected  NONE DETECTED (Cut Off Level 10 ng/mL) Final   POC Oxazepam (BZO) 12/16/2024 None Detected  NONE DETECTED (Cut Off Level 300 ng/mL) Final   POC Cocaine UR 12/16/2024 Positive (A)  NONE DETECTED (Cut Off Level 300 ng/mL) Final   POC Methamphetamine UR 12/16/2024 None Detected  NONE DETECTED (Cut Off Level 1000 ng/mL) Final   POC Morphine 12/16/2024 None Detected  NONE DETECTED (Cut Off Level 300 ng/mL) Final   POC Methadone UR 12/16/2024 None Detected  NONE DETECTED (Cut Off Level 300 ng/mL) Final   POC Oxycodone UR 12/16/2024 None Detected  NONE DETECTED (Cut Off Level 100 ng/mL) Final   POC Marijuana UR 12/16/2024 Positive (A)  NONE DETECTED (Cut Off Level 50 ng/mL) Final   HIV Screen 4th Generation wRfx 12/16/2024 Non Reactive  Non Reactive Final   Performed at [facility], [address] [place], [place] [address]  Admission on 11/10/2024, Discharged on 11/10/2024  Component Date Value Ref Range Status   RPR Ser Ql 11/10/2024 NON REACTIVE  NON REACTIVE Final   Performed at [facility], [address], [place], [place] [address]   Specimen Source - HSV 11/10/2024 GENITAL   Final   HSV-1 DNA 11/10/2024 NOT DETECTED  NOT DETECTED Final   HSV-2 DNA 11/10/2024 DETECTED (A)  NOT DETECTED Final   Performed at [facility], [address], [place], [place] [address]   Neisseria Gonorrhea 11/10/2024 Negative   Final   Chlamydia 11/10/2024 Negative   Final   Trichomonas 11/10/2024 Negative   Final   Comment 11/10/2024 Normal Reference Range Trichomonas - Negative   Final   Comment 11/10/2024 Normal Reference Ranger Chlamydia - Negative   Final   Comment 11/10/2024 Normal Reference Range Neisseria Gonorrhea - Negative   Final   [facility] test code 11/10/2024 [id number]   Final   [facility] test name 11/10/2024 [id number]   Final  Source ([facility]) 11/10/2024 SERUM   Final   Performed at [facility], [address], [place], [place] [address]   Misc [facility] result  11/10/2024 COMMENT   Final   Comment: (NOTE) Test Ordered: [id number] HIV Ab/p24 Ag with Reflex HIV Ab/p24 Ag Screen           Note:                     BN     Non Reactive                                                  Reference Range: Non Reactive                          HIV-1/HIV-2 antibodies and HIV-1 p24 antigen were NOT detected. There is no laboratory evidence of HIV infection. HIV Negative Performed At: [facility] [address] [place], [place] [id number] Jennette Shorter MD Ey:[id number]   Admission on 07/12/2024, Discharged on 07/12/2024  Component Date Value Ref Range Status   Neisseria Gonorrhea 07/12/2024 Negative   Final   Chlamydia 07/12/2024 Negative   Final   Comment 07/12/2024 Normal Reference Ranger Chlamydia - Negative   Final   Comment 07/12/2024 Normal Reference Range Neisseria Gonorrhea - Negative   Final   RPR Ser Ql 07/12/2024 NON REACTIVE  NON REACTIVE Final   Performed at [facility], [address], [place], [place] [address]   HIV Screen 4th Generation wRfx 07/12/2024 Non Reactive  Non Reactive Final   Performed at [facility], [address], [place], [place] [address]    Blood Alcohol level:  Lab Results  Component Value Date   [facility] <15 12/16/2024   ETH <5 08/17/2016    Metabolic Disorder Labs: Lab Results  Component Value Date   HGBA1C 5.5 12/16/2024   MPG 111.15 12/16/2024   No results found for: PROLACTIN Lab Results  Component Value Date   CHOL 165 12/16/2024   TRIG 90 12/16/2024   HDL 49 12/16/2024   CHOLHDL 3.3 12/16/2024   VLDL 18 12/16/2024   LDLCALC 98 12/16/2024    Therapeutic Lab Levels: No results found for: LITHIUM No results found for: VALPROATE No results found for: CBMZ  Physical Findings   PHQ2-9    Flowsheet Row ED from 12/17/2024 in [facility] ED from 12/16/2024 in [facility]  PHQ-2 Total Score 2 2  PHQ-9 Total Score 8 7    Flowsheet Row ED from 12/17/2024 in [facility] ED from 12/16/2024 in [facility] UC from 11/10/2024 in [facility] RISK CATEGORY No Risk No Risk No Risk     Musculoskeletal  Strength & Muscle Tone: within normal limits Gait & Station: normal Patient leans: N/A  Psychiatric Specialty Exam  Presentation  General Appearance:  Appropriate for Environment  Eye Contact: Fair  Speech: Clear and Coherent  Speech Volume: Normal  Handedness: Right   Mood and Affect  Mood: Euthymic  Affect: Appropriate   Thought Process  Thought Processes: Goal Directed; Coherent  Descriptions of Associations:Intact  Orientation:Full (Time, Place and Person)  Thought Content:WDL  Diagnosis of Schizophrenia or Schizoaffective disorder in past: No  Hallucinations:No data recorded Ideas of Reference:None  Suicidal Thoughts:No data recorded Homicidal Thoughts:No data recorded  Sensorium  Memory: Immediate Fair; Recent Fair; Remote Fair  Judgment: Fair  Insight: Fair   Chartered Certified Accountant: Fair  Attention Span: Fair  Recall: [employer] of Knowledge: Fair  Language: Fair   Psychomotor Activity  Psychomotor Activity:No data recorded  Assets  Assets: Communication Skills; Desire for Improvement   Sleep  Sleep:No data recorded Estimated Sleeping Duration (Last 24 Hours): 8.75-10.75 hours  No data recorded  Physical Exam  Physical Exam ROS Blood pressure 136/84, pulse 73, temperature 98 F (36.7 C), temperature source Oral, resp. rate 16, SpO2 100%. There is no height or weight on file to calculate BMI.  Treatment Plan Summary: Daily contact with patient to assess and evaluate symptoms and progress in treatment and Medication management   58yo patient presents with alcohol and cocaine use disorders in the setting of significant recent  psychosocial stressors. Given ongoing substance use, impaired coping, limited supports, and risk for further decompensation, continued admission to the [facility] is clinically indicated for stabilization, monitoring, and coordination of treatment planning.     Plan:  Continue admission to [facility] for substance use stabilization and safety monitoring.  continue CIWA protocol with PRN Librium  for management of alcohol withdrawal symptoms.  Psych medications: No scheduled medications ordered. PRN medications for anxiety and insomnia.  Started Ibuprofen  800mg  PO TID for 2 days for acute gout attack.  Social Work consultation for substance use treatment planning, including discussion of residential treatment options, financial and housing concerns, and coordination of aftercare resources.  Disposition Planning: Ongoing assessment of readiness for step-down care with consideration of patients housing and employment concerns.  Neil Appl, MD 12/18/2024 8:56 AM

## 2024-12-18 NOTE — ED Notes (Signed)
 Pt resting quietly in room without complaints.  Safety maintained.

## 2024-12-18 NOTE — ED Notes (Signed)
 Pt. Quiet and without complaints safety rounds maintained.

## 2024-12-18 NOTE — Group Note (Signed)
 Group Topic: Wellness  Group Date: 12/18/2024 Start Time: 2030 End Time: 2100 Facilitators: Anice Benton LABOR, NT  Department: [facility]  Number of Participants: 5  Group Focus: check in Treatment Modality:  Patient-Centered Therapy Interventions utilized were support Purpose: express feelings  Name: Anthony Leblanc Date of Birth: [birth year]  MR: [id number]    Level of Participation: active Quality of Participation: cooperative Interactions with others: gave feedback Mood/Affect: appropriate Triggers (if applicable): N/A Cognition: coherent/clear Progress: Moderate Response: good Plan: follow-up needed  Patients Problems:  Patient Active Problem List   Diagnosis Date Noted   Alcohol use disorder, severe, dependence (HCC) 12/17/2024

## 2024-12-18 NOTE — Group Note (Signed)
 Group Topic: Recovery Basics  Group Date: 12/18/2024 Start Time: 1200 End Time: 1230 Facilitators: Makyia Erxleben, Zane HERO, RN  Department: [facility]  Number of Participants: 1  Group Focus: nursing group Treatment Modality:  Individual Therapy Interventions utilized were patient education Purpose: increase insight  Name: Anthony Leblanc Date of Birth: 1966/07/09  MR: [id number]    Level of Participation: active Quality of Participation: cooperative, pleasant Interactions with others: gave feedback Mood/Affect: appropriate Triggers (if applicable): None identified at this time Cognition: coherent/clear and logical Progress: Gaining insight Response: Patient voiced understanding of medications and treatment plan. Voices understanding of who to speak with in case further questions arise. Plan: patient will be encouraged to continue to attend groups/programming on the unit  Patients Problems:  Patient Active Problem List   Diagnosis Date Noted   Alcohol use disorder, severe, dependence (HCC) 12/17/2024

## 2024-12-18 NOTE — ED Notes (Signed)
 Patient denies pain and is resting comfortably.

## 2024-12-18 NOTE — ED Notes (Signed)
 Patient is in the dayroom calm and composed, watching TV with other patients. NAD.Denies SI/HI Environment secured per policy. Respirations even and unlabored. Will monitor for safety.

## 2024-12-19 DIAGNOSIS — F1410 Cocaine abuse, uncomplicated: Secondary | ICD-10-CM | POA: Diagnosis not present

## 2024-12-19 DIAGNOSIS — F1020 Alcohol dependence, uncomplicated: Secondary | ICD-10-CM | POA: Diagnosis not present

## 2024-12-19 DIAGNOSIS — F419 Anxiety disorder, unspecified: Secondary | ICD-10-CM | POA: Diagnosis not present

## 2024-12-19 DIAGNOSIS — M79675 Pain in left toe(s): Secondary | ICD-10-CM | POA: Diagnosis not present

## 2024-12-19 MED ORDER — MELOXICAM 7.5 MG PO TABS: 7.5000 mg | ORAL_TABLET | Freq: Every day | ORAL | 0 refills | Status: AC

## 2024-12-19 MED ORDER — IBUPROFEN 400 MG PO TABS
800.0000 mg | ORAL_TABLET | Freq: Three times a day (TID) | ORAL | Status: DC
  Administered 2024-12-19: 800 mg via ORAL
  Filled 2024-12-19: qty 2

## 2024-12-19 NOTE — ED Provider Notes (Signed)
 Behavioral Health Progress Note  Date and Time: 12/19/2024 8:24 AM Name: Anthony Leblanc MRN:  [id number]  Anthony Leblanc is a 58yo M with a psych h/o alcohol use disorder, cocaine use disorder, who was admitted to [facility] unit for substance use disorder treatment.   Interval History Patient was seen today for re-evaluation.  Nursing reports no events overnight. The patient has no issues with performing ADLs.  Patient has been medication compliant.    Patient was seen and interviewed by attending psychiatrist. Chart reviewed. Patient discussed during treatment team rounds.  Subjective:  On assessment patient reports feeling good, much better that I was. Says that his although L toe pain (due to gout) has improved too after initiation of high dose Ibuprofen , rates it as 3/10. Patient identifies his mood as good today, denies feeling depressed. He is anxious due to psycho-social stressors, mostly financial. He reports good sleep and appetite. He denies auditory or visual hallucinations, denies feeling paranoid, unsafe, does not express any delusions. He denies thoughts or plans of hurting self or others. He reports no side effects from medications he is getting here. He denies other physical complaints. No current symptoms of any substance withdrawal. CIWA - 0.  Labs: no new results for review.    Diagnosis:  Final diagnoses:  Polysubstance abuse (HCC)  Alcohol use disorder, severe, dependence (HCC)  Cocaine use disorder (HCC)    Total Time spent with patient: 30 minutes  Past Psychiatric History:  The patient denies any history of inpatient psychiatric hospitalizations or suicide attempts. He reports multiple prior admissions to substance use rehabilitation programs. His longest period of sobriety was approximately 1.5 years. He denies a history of manic or psychotic symptoms. There is no reported history of self-harm behaviors. He is currently seeking treatment for substance use and has no  active psychiatric complaints outside of stress and substance-related issues.   Past Medical History: HLD   Family History: unremarkable   Social History: lives alone, separated from wife, grown children, unemployed. No access to guns. Has been spending about 20$-100$ a day on alcohol and cocaine I spend as much as I can afford. Patient reports he started using alcohol at age 1, cocaine in his 2s.   Sleep: Good  Appetite:  Good  Current Medications:  Current Facility-Administered Medications  Medication Dose Route Frequency Provider Last Rate Last Admin   alum & mag hydroxide-simeth (MAALOX/MYLANTA) 200-200-20 MG/5ML suspension 30 mL  30 mL Oral Q4H PRN Onuoha, Chinwendu V, NP       chlordiazePOXIDE  (LIBRIUM ) capsule 25 mg  25 mg Oral Q6H PRN Onuoha, Chinwendu V, NP       dicyclomine  (BENTYL ) tablet 20 mg  20 mg Oral Q6H PRN Onuoha, Chinwendu V, NP       hydrOXYzine  (ATARAX ) tablet 25 mg  25 mg Oral Q6H PRN Onuoha, Chinwendu V, NP       loperamide  (IMODIUM ) capsule 2-4 mg  2-4 mg Oral PRN Onuoha, Chinwendu V, NP       magnesium  hydroxide (MILK OF MAGNESIA) suspension 30 mL  30 mL Oral Daily PRN Onuoha, Chinwendu V, NP       methocarbamol  (ROBAXIN ) tablet 500 mg  500 mg Oral Q8H PRN Onuoha, Chinwendu V, NP       multivitamin with minerals tablet 1 tablet  1 tablet Oral Daily Onuoha, Chinwendu V, NP   1 tablet at 12/18/24 0926   naproxen  (NAPROSYN ) tablet 500 mg  500 mg Oral BID PRN Onuoha, Chinwendu V,  NP   500 mg at 12/17/24 9057   OLANZapine  (ZYPREXA ) injection 5 mg  5 mg Intramuscular TID PRN Onuoha, Chinwendu V, NP       OLANZapine  zydis (ZYPREXA ) disintegrating tablet 5 mg  5 mg Oral TID PRN Onuoha, Chinwendu V, NP       ondansetron  (ZOFRAN -ODT) disintegrating tablet 4 mg  4 mg Oral Q6H PRN Onuoha, Chinwendu V, NP       Current Outpatient Medications  Medication Sig Dispense Refill   ezetimibe (ZETIA) 10 MG tablet Take 10 mg by mouth daily.     meloxicam  (MOBIC ) 7.5 MG tablet  Take 7.5 mg by mouth daily.     Multiple Vitamin (MULTIVITAMIN WITH MINERALS) TABS tablet Take 1 tablet by mouth daily.     Omega-3 Fatty Acids (FISH OIL) 1000 MG CPDR Take 1,000-3,000 mg by mouth daily.     tadalafil (CIALIS) 20 MG tablet Take 20 mg by mouth daily as needed for erectile dysfunction.      Labs  Lab Results:  Admission on 12/16/2024, Discharged on 12/17/2024  Component Date Value Ref Range Status   WBC 12/16/2024 4.8  4.0 - 10.5 K/uL Final   RBC 12/16/2024 5.05  4.22 - 5.81 MIL/uL Final   Hemoglobin 12/16/2024 14.8  13.0 - 17.0 g/dL Final   HCT 87/76/7974 44.7  39.0 - 52.0 % Final   MCV 12/16/2024 88.5  80.0 - 100.0 fL Final   MCH 12/16/2024 29.3  26.0 - 34.0 pg Final   MCHC 12/16/2024 33.1  30.0 - 36.0 g/dL Final   RDW 87/76/7974 12.6  11.5 - 15.5 % Final   Platelets 12/16/2024 207  150 - 400 K/uL Final   nRBC 12/16/2024 0.0  0.0 - 0.2 % Final   Neutrophils Relative % 12/16/2024 55  % Final   Neutro Abs 12/16/2024 2.7  1.7 - 7.7 K/uL Final   Lymphocytes Relative 12/16/2024 34  % Final   Lymphs Abs 12/16/2024 1.7  0.7 - 4.0 K/uL Final   Monocytes Relative 12/16/2024 9  % Final   Monocytes Absolute 12/16/2024 0.4  0.1 - 1.0 K/uL Final   Eosinophils Relative 12/16/2024 2  % Final   Eosinophils Absolute 12/16/2024 0.1  0.0 - 0.5 K/uL Final   Basophils Relative 12/16/2024 0  % Final   Basophils Absolute 12/16/2024 0.0  0.0 - 0.1 K/uL Final   Immature Granulocytes 12/16/2024 0  % Final   Abs Immature Granulocytes 12/16/2024 0.01  0.00 - 0.07 K/uL Final   Performed at [facility], [address] [place], [place] [address]   Sodium 12/16/2024 141  135 - 145 mmol/L Final   Potassium 12/16/2024 4.4  3.5 - 5.1 mmol/L Final   Chloride 12/16/2024 106  98 - 111 mmol/L Final   CO2 12/16/2024 21 (L)  22 - 32 mmol/L Final   Glucose, Bld 12/16/2024 72  70 - 99 mg/dL Final   Glucose reference range applies only to samples taken after fasting for at least 8 hours.   BUN  12/16/2024 11  6 - 20 mg/dL Final   Creatinine, Ser 12/16/2024 1.12  0.61 - 1.24 mg/dL Final   Calcium  12/16/2024 9.5  8.9 - 10.3 mg/dL Final   Total Protein 87/76/7974 7.5  6.5 - 8.1 g/dL Final   Albumin 87/76/7974 4.3  3.5 - 5.0 g/dL Final   AST 87/76/7974 26  15 - 41 U/L Final   HEMOLYSIS AT THIS LEVEL MAY AFFECT RESULT   ALT 12/16/2024  18  0 - 44 U/L Final   Alkaline Phosphatase 12/16/2024 79  38 - 126 U/L Final   Total Bilirubin 12/16/2024 0.6  0.0 - 1.2 mg/dL Final   GFR, Estimated 12/16/2024 >60  >60 mL/min Final   Comment: (NOTE) Calculated using the CKD-EPI Creatinine Equation (2021)    Anion gap 12/16/2024 14  5 - 15 Final   Performed at [facility], [address], [place], [place] [address]   Hgb A1c MFr Bld 12/16/2024 5.5  4.8 - 5.6 % Final   Comment: (NOTE) Diagnosis of Diabetes The following HbA1c ranges recommended by the American Diabetes Association (ADA) may be used as an aid in the diagnosis of diabetes mellitus.  Hemoglobin             Suggested A1C NGSP%              Diagnosis  <5.7                   Non Diabetic  5.7-6.4                Pre-Diabetic  >6.4                   Diabetic  <7.0                   Glycemic control for                       adults with diabetes.     Mean Plasma Glucose 12/16/2024 111.15  mg/dL Final   Performed at [facility], [address], [place], [place] [address]   Magnesium  12/16/2024 2.5 (H)  1.7 - 2.4 mg/dL Final   Performed at [facility], [address], [place], [place] [address]   Alcohol, Ethyl (B) 12/16/2024 <15  <15 mg/dL Final   Comment: (NOTE) For medical purposes only. Performed at [facility], [address], [place], [place] [address]    Cholesterol 12/16/2024 165  0 - 200 mg/dL Final   Comment:        ATP III CLASSIFICATION:  <200     mg/dL   Desirable  799-760  mg/dL   Borderline High  >=759    mg/dL   High           Triglycerides 12/16/2024 90  <150 mg/dL Final    HDL 87/76/7974 49  >40 mg/dL Final   Total CHOL/HDL Ratio 12/16/2024 3.3  RATIO Final   VLDL 12/16/2024 18  0 - 40 mg/dL Final   LDL Cholesterol 12/16/2024 98  0 - 99 mg/dL Final   Comment:        Total Cholesterol/HDL:CHD Risk Coronary Heart Disease Risk Table                     Men   Women  1/2 Average Risk   3.4   3.3  Average Risk       5.0   4.4  2 X Average Risk   9.6   7.1  3 X Average Risk  23.4   11.0        Use the calculated Patient Ratio above and the CHD Risk Table to determine the patient's CHD Risk.        ATP III CLASSIFICATION (LDL):  <100     mg/dL   Optimal  899-870  mg/dL   Near or  Above                    Optimal  130-159  mg/dL   Borderline  839-810  mg/dL   High  >809     mg/dL   Very High Performed at [facility], [address], [place], [place] [address]    TSH 12/16/2024 0.855  0.350 - 4.500 uIU/mL Final   Performed at [facility], [address], [place], [place] [address]   Color, Urine 12/16/2024 YELLOW  YELLOW Final   APPearance 12/16/2024 CLEAR  CLEAR Final   Specific Gravity, Urine 12/16/2024 1.020  1.005 - 1.030 Final   pH 12/16/2024 7.0  5.0 - 8.0 Final   Glucose, UA 12/16/2024 NEGATIVE  NEGATIVE mg/dL Final   Hgb urine dipstick 12/16/2024 NEGATIVE  NEGATIVE Final   Bilirubin Urine 12/16/2024 NEGATIVE  NEGATIVE Final   Ketones, ur 12/16/2024 NEGATIVE  NEGATIVE mg/dL Final   Protein, ur 87/76/7974 NEGATIVE  NEGATIVE mg/dL Final   Nitrite 87/76/7974 NEGATIVE  NEGATIVE Final   Leukocytes,Ua 12/16/2024 NEGATIVE  NEGATIVE Final   Performed at [facility], [address], [place], [place] [address]   POC Amphetamine UR 12/16/2024 None Detected  NONE DETECTED (Cut Off Level 1000 ng/mL) Final   POC Secobarbital (BAR) 12/16/2024 None Detected  NONE DETECTED (Cut Off Level 300 ng/mL) Final   POC Buprenorphine (BUP) 12/16/2024 None Detected  NONE DETECTED (Cut Off Level 10 ng/mL) Final   POC Oxazepam (BZO) 12/16/2024 None  Detected  NONE DETECTED (Cut Off Level 300 ng/mL) Final   POC Cocaine UR 12/16/2024 Positive (A)  NONE DETECTED (Cut Off Level 300 ng/mL) Final   POC Methamphetamine UR 12/16/2024 None Detected  NONE DETECTED (Cut Off Level 1000 ng/mL) Final   POC Morphine 12/16/2024 None Detected  NONE DETECTED (Cut Off Level 300 ng/mL) Final   POC Methadone UR 12/16/2024 None Detected  NONE DETECTED (Cut Off Level 300 ng/mL) Final   POC Oxycodone UR 12/16/2024 None Detected  NONE DETECTED (Cut Off Level 100 ng/mL) Final   POC Marijuana UR 12/16/2024 Positive (A)  NONE DETECTED (Cut Off Level 50 ng/mL) Final   HIV Screen 4th Generation wRfx 12/16/2024 Non Reactive  Non Reactive Final   Performed at [facility], [address], [place], [place] [address]  Admission on 11/10/2024, Discharged on 11/10/2024  Component Date Value Ref Range Status   RPR Ser Ql 11/10/2024 NON REACTIVE  NON REACTIVE Final   Performed at [facility], [address], [place], [place] [address]   Specimen Source - HSV 11/10/2024 GENITAL   Final   HSV-1 DNA 11/10/2024 NOT DETECTED  NOT DETECTED Final   HSV-2 DNA 11/10/2024 DETECTED (A)  NOT DETECTED Final   Performed at [facility], [address], [place], [place] [address]   Neisseria Gonorrhea 11/10/2024 Negative   Final   Chlamydia 11/10/2024 Negative   Final   Trichomonas 11/10/2024 Negative   Final   Comment 11/10/2024 Normal Reference Range Trichomonas - Negative   Final   Comment 11/10/2024 Normal Reference Ranger Chlamydia - Negative   Final   Comment 11/10/2024 Normal Reference Range Neisseria Gonorrhea - Negative   Final   [facility] test code 11/10/2024 [id number]   Final   [facility] test name 11/10/2024 [id number]   Final   Source ([facility]) 11/10/2024 SERUM   Final   Performed at [facility], [address] [place],  [place] [address]   Misc [facility] result 11/10/2024 COMMENT   Final   Comment: (NOTE) Test Ordered: [id number] HIV Ab/p24 Ag with Reflex HIV  Ab/p24 Ag Screen           Note:                     BN     Non Reactive                                                  Reference Range: Non Reactive                          HIV-1/HIV-2 antibodies and HIV-1 p24 antigen were NOT detected. There is no laboratory evidence of HIV infection. HIV Negative Performed At: [facility] [address] [place], [place] [id number] Jennette Shorter MD Ey:[id number]   Admission on 07/12/2024, Discharged on 07/12/2024  Component Date Value Ref Range Status   Neisseria Gonorrhea 07/12/2024 Negative   Final   Chlamydia 07/12/2024 Negative   Final   Comment 07/12/2024 Normal Reference Ranger Chlamydia - Negative   Final   Comment 07/12/2024 Normal Reference Range Neisseria Gonorrhea - Negative   Final   RPR Ser Ql 07/12/2024 NON REACTIVE  NON REACTIVE Final   Performed at [facility], [address], [place], [place] [address]   HIV Screen 4th Generation wRfx 07/12/2024 Non Reactive  Non Reactive Final   Performed at [facility], [address] [place], [place] [address]    Blood Alcohol level:  Lab Results  Component Value Date   [facility] <15 12/16/2024   ETH <5 08/17/2016    Metabolic Disorder Labs: Lab Results  Component Value Date   HGBA1C 5.5 12/16/2024   MPG 111.15 12/16/2024   No results found for: PROLACTIN Lab Results  Component Value Date   CHOL 165 12/16/2024   TRIG 90 12/16/2024   HDL 49 12/16/2024   CHOLHDL 3.3 12/16/2024   VLDL 18 12/16/2024   LDLCALC 98 12/16/2024    Therapeutic Lab Levels: No results found for: LITHIUM No results found for: VALPROATE No results found for: CBMZ  Physical Findings   PHQ2-9    Flowsheet Row ED from 12/17/2024 in [facility] ED from 12/16/2024 in [facility]  PHQ-2 Total Score 2 2  PHQ-9 Total Score 8 7   Flowsheet Row ED from 12/17/2024 in [facility] ED from  12/16/2024 in [facility] UC from 11/10/2024 in [facility] RISK CATEGORY No Risk No Risk No Risk     Musculoskeletal  Strength & Muscle Tone: within normal limits Gait & Station: normal Patient leans: N/A  Psychiatric Specialty Exam  Presentation  General Appearance:  Appropriate for Environment  Eye Contact: Fair  Speech: Clear and Coherent  Speech Volume: Normal  Handedness: Right   Mood and Affect  Mood: Euthymic  Affect: Appropriate   Thought Process  Thought Processes: Goal Directed; Coherent  Descriptions of Associations:Intact  Orientation:Full (Time, Place and Person)  Thought Content:WDL  Diagnosis of Schizophrenia or Schizoaffective disorder in past: No    Hallucinations:No data recorded Ideas of Reference:None  Suicidal Thoughts:No data recorded Homicidal Thoughts:No data recorded  Sensorium  Memory: Immediate Fair; Recent Fair; Remote Fair  Judgment: Fair  Insight: Fair   Chartered Certified Accountant: Fair  Attention Span: Fair  Recall: [employer] of Knowledge: Fair  Language: Fair   Psychomotor Activity  Psychomotor Activity:No data recorded  Assets  Assets: Communication Skills; Desire for Improvement   Sleep  Sleep:No data recorded Estimated Sleeping Duration (Last 24 Hours): 8.75-10.50 hours  No data recorded  Physical Exam  Physical Exam ROS Blood pressure 133/70, pulse 81, temperature 98 F (36.7 C), temperature source Oral, resp. rate 18, SpO2 100%. There is no height or weight on file to calculate BMI.  Treatment Plan Summary: Daily contact with patient to assess and evaluate symptoms and progress in treatment and Medication management   58yo patient presents with alcohol and cocaine use disorders in the setting of significant recent psychosocial stressors. Given ongoing substance use, impaired coping, limited supports, and  risk for further decompensation, continued admission to the [facility] is clinically indicated for stabilization, monitoring, and coordination of treatment planning.     Plan:  Continue admission to [facility] for substance use stabilization and safety monitoring.  Will discontinue CIWA protocol.  Psych medications: No scheduled medications ordered. PRN medications for anxiety and insomnia.  Continue Ibuprofen  800mg  PO TID for 1 more day for acute gout attack.  Social Work consultation for substance use treatment planning, including discussion of residential treatment options, financial and housing concerns, and coordination of aftercare resources.  Disposition Planning: Ongoing assessment of readiness for step-down care with consideration of patients housing and employment concerns.  Neil Appl, MD 12/19/2024 8:24 AM

## 2024-12-19 NOTE — ED Notes (Signed)
 Patient awake from the noise from another patient.  Requested for water and went back to sleep. NAD.

## 2024-12-19 NOTE — Care Management (Addendum)
 [facility] Care Management...  Addendum 12/19/2024  Patient requesting discharge today 12/19/2024  Patient will discharge to home  [address]  [place] [id number]   Patient has his own transportation in [facility] parking lot  30 day Scripts  Patient has follow-up appointment for residential with...  [facility] - [facility] [address] [place], [place] [address] Phone: [phone]   Appointment scheduled for Monday Jan 5th at 7:45 am to complete two part intake process  Patient is to call prior to appointment to confirm    Writer will provide listing of  [facility] treatment facilities    Addendum 10:19 am  Patient reported making contact with [employer] and they will email paperwork to delay foreclosure proceedings  Patient reported that he may need to discharge today, he is unsure  Writer advised patient to keep me updated   Writer met with patient to discuss discharge planning...  Per Dorian @ [facility]  Patient's medicaid is pending, they do not accept pending  Per Olivia @ [facility] under review  Patient asked for numbers for [employer] and Ar[employer]ist provided patient with numbers

## 2024-12-19 NOTE — Discharge Instructions (Addendum)
 [facility] Care Management...  Patient requested inpatient treatment  Patient requesting discharge today 12/19/2024  Patient will discharge to home  [address]  [place] [id number]   Patient has his own transportation in [facility] parking lot  30 day Scripts  Patient has follow-up appointment for residential with...  [facility] - [facility] [address] [place], [place] [address] Phone: [phone]   Appointment scheduled for Monday Jan 5th at 7:45 am to complete two part intake process  Patient is to call prior to appointment to confirm     Medicaid  and Non-Insured   SUBSTANCE USE TREATMENT FACILITIES   [facility] [address], [place], [address] [phone] phone NOTE: [facility] is no longer offering IOP services.  Serves those who are low-income or have no insurance.  [facility] [address], [place], [place], [address] [phone] phone [phone] fax NOTE: Does have Substance Abuse-Intensive Outpatient Program [facility]) as well as transitional housing if eligible.  [facility] [address] [place], [place], [address] [phone] phone [phone] fax  [facility] [phone] [place], [place], [address] [phone] phone [phone] fax

## 2024-12-19 NOTE — ED Provider Notes (Signed)
 FBC/OBS ASAP Discharge Summary  Date and Time: 12/19/2024 12:55 PM  Name: Anthony Leblanc  MRN:  [id number]   Discharge Diagnoses:  Final diagnoses:  Polysubstance abuse (HCC)  Alcohol use disorder, severe, dependence (HCC)  Cocaine use disorder (HCC)    Subjective: Anthony Leblanc is a 58yo M with a psych h/o alcohol use disorder, cocaine use disorder, who was admitted to [facility] unit for substance use disorder treatment.   Stay Summary:  The patient was admitted for stabilization and monitored on the CIWA protocol. He received PRN medications for management of withdrawal-related symptoms, including anxiety, tremor, and insomnia, with good clinical response. Over the course of admission, withdrawal symptoms steadily improved. No scheduled psychotropic medications ordered. The patient showed no evidence of acute psychiatric decompensation. No safety concerns emerged during hospitalization. Condition at Discharge: At the time of discharge, the patient reports feeling significantly improved and states he is feeling well. He denies any current mental or physical complaints, including withdrawal symptoms. He continues to deny suicidal or homicidal ideation, hallucinations, or mood instability. He is requesting discharge and appears clinically stable.  Discharge Plan: Patient discharged home in stable condition. Outpatient substance use treatment resources were provided and discussed Encouraged to engage in ongoing substance use treatment and psychiatric follow-up Advised to seek immediate medical or psychiatric care if symptoms recur or worsen  Final Diagnoses: Alcohol use disorder.  Mental Status Examination (MSE): The patient appears his stated age. He is appropriately groomed and dressed in hospital attire. He is calm and cooperative during the evaluation, with improved engagement compared to admission. Eye contact is appropriate. No psychomotor agitation or retardation is observed. Speech is  clear, coherent, and of normal rate, rhythm, and volume. Mood is described as good, and affect is euthymic, stable, and congruent with stated mood. Thought processes are linear, logical, and goal-directed. Thought content is appropriate, with no evidence of delusions, paranoia, or obsessive thinking. The patient denies auditory or visual hallucinations and does not appear to be responding to internal stimuli. He denies suicidal or homicidal ideation, intent, or plan. He is alert and fully oriented to person, place, time, and situation. Attention and concentration are grossly intact. Recent and remote memory appear intact. Insight into his substance use and mental health conditions is fair, and judgment is intact as evidenced by appropriate help-seeking behavior and participation in treatment.   Total Time spent with patient: 1 hour  Past Psychiatric History:  The patient denies any history of inpatient psychiatric hospitalizations or suicide attempts. He reports multiple prior admissions to substance use rehabilitation programs. His longest period of sobriety was approximately 1.5 years. He denies a history of manic or psychotic symptoms. There is no reported history of self-harm behaviors. He is currently seeking treatment for substance use and has no active psychiatric complaints outside of stress and substance-related issues.   Past Medical History: HLD   Family History: unremarkable   Social History: lives alone, separated from wife, grown children, unemployed. No access to guns. Has been spending about 20$-100$ a day on alcohol and cocaine I spend as much as I can afford. Patient reports he started using alcohol at age 46, cocaine in his 11s.    Tobacco Cessation:  A prescription for an FDA-approved tobacco cessation medication was offered at discharge and the patient refused  Current Medications:  Current Facility-Administered Medications  Medication Dose Route Frequency Provider Last  Rate Last Admin   alum & mag hydroxide-simeth (MAALOX/MYLANTA) 200-200-20 MG/5ML suspension 30 mL  30 mL  Oral Q4H PRN Onuoha, Chinwendu V, NP       chlordiazePOXIDE  (LIBRIUM ) capsule 25 mg  25 mg Oral Q6H PRN Onuoha, Chinwendu V, NP       dicyclomine  (BENTYL ) tablet 20 mg  20 mg Oral Q6H PRN Onuoha, Chinwendu V, NP       hydrOXYzine  (ATARAX ) tablet 25 mg  25 mg Oral Q6H PRN Onuoha, Chinwendu V, NP       ibuprofen  (ADVIL ) tablet 800 mg  800 mg Oral TID Juaquin Ludington, MD   800 mg at 12/19/24 1055   loperamide  (IMODIUM ) capsule 2-4 mg  2-4 mg Oral PRN Onuoha, Chinwendu V, NP       magnesium  hydroxide (MILK OF MAGNESIA) suspension 30 mL  30 mL Oral Daily PRN Onuoha, Chinwendu V, NP       methocarbamol  (ROBAXIN ) tablet 500 mg  500 mg Oral Q8H PRN Onuoha, Chinwendu V, NP       multivitamin with minerals tablet 1 tablet  1 tablet Oral Daily Onuoha, Chinwendu V, NP   1 tablet at 12/19/24 1055   naproxen  (NAPROSYN ) tablet 500 mg  500 mg Oral BID PRN Onuoha, Chinwendu V, NP   500 mg at 12/17/24 9057   OLANZapine  (ZYPREXA ) injection 5 mg  5 mg Intramuscular TID PRN Onuoha, Chinwendu V, NP       OLANZapine  zydis (ZYPREXA ) disintegrating tablet 5 mg  5 mg Oral TID PRN Onuoha, Chinwendu V, NP       ondansetron  (ZOFRAN -ODT) disintegrating tablet 4 mg  4 mg Oral Q6H PRN Onuoha, Chinwendu V, NP       Current Outpatient Medications  Medication Sig Dispense Refill   ezetimibe (ZETIA) 10 MG tablet Take 10 mg by mouth daily.     meloxicam  (MOBIC ) 7.5 MG tablet Take 7.5 mg by mouth daily.     Multiple Vitamin (MULTIVITAMIN WITH MINERALS) TABS tablet Take 1 tablet by mouth daily.     Omega-3 Fatty Acids (FISH OIL) 1000 MG CPDR Take 1,000-3,000 mg by mouth daily.     tadalafil (CIALIS) 20 MG tablet Take 20 mg by mouth daily as needed for erectile dysfunction.      PTA Medications:  Facility Ordered Medications  Medication   alum & mag hydroxide-simeth (MAALOX/MYLANTA) 200-200-20 MG/5ML suspension 30 mL    magnesium  hydroxide (MILK OF MAGNESIA) suspension 30 mL   OLANZapine  zydis (ZYPREXA ) disintegrating tablet 5 mg   OLANZapine  (ZYPREXA ) injection 5 mg   [COMPLETED] thiamine  (VITAMIN B1) injection 100 mg   multivitamin with minerals tablet 1 tablet   chlordiazePOXIDE  (LIBRIUM ) capsule 25 mg   hydrOXYzine  (ATARAX ) tablet 25 mg   loperamide  (IMODIUM ) capsule 2-4 mg   ondansetron  (ZOFRAN -ODT) disintegrating tablet 4 mg   dicyclomine  (BENTYL ) tablet 20 mg   methocarbamol  (ROBAXIN ) tablet 500 mg   naproxen  (NAPROSYN ) tablet 500 mg   [COMPLETED] ibuprofen  (ADVIL ) tablet 800 mg   ibuprofen  (ADVIL ) tablet 800 mg   PTA Medications  Medication Sig   meloxicam  (MOBIC ) 7.5 MG tablet Take 7.5 mg by mouth daily.   ezetimibe (ZETIA) 10 MG tablet Take 10 mg by mouth daily.       12/17/2024    2:54 AM 12/16/2024    6:41 PM  Depression screen PHQ 2/9  Decreased Interest 0 1  Down, Depressed, Hopeless 2 1  PHQ - 2 Score 2 2  Altered sleeping 2 1  Tired, decreased energy 0 1  Change in appetite 0 0  Feeling bad or failure about yourself  0 1  Trouble concentrating 2 1  Moving slowly or fidgety/restless 2 1  Suicidal thoughts 0 0  PHQ-9 Score 8 7  Difficult doing work/chores Somewhat difficult Somewhat difficult    Flowsheet Row ED from 12/17/2024 in [facility] ED from 12/16/2024 in [facility] from 11/10/2024 in [facility] RISK CATEGORY No Risk No Risk No Risk    Musculoskeletal  Strength & Muscle Tone: within normal limits Gait & Station: normal Patient leans: N/A  Psychiatric Specialty Exam  Presentation  General Appearance:  Appropriate for Environment  Eye Contact: Fair  Speech: Clear and Coherent  Speech Volume: Normal  Handedness: Right   Mood and Affect  Mood: Euthymic  Affect: Appropriate   Thought Process  Thought Processes: Goal Directed;  Coherent  Descriptions of Associations:Intact  Orientation:Full (Time, Place and Person)  Thought Content:WDL  Diagnosis of Schizophrenia or Schizoaffective disorder in past: No    Hallucinations:No data recorded Ideas of Reference:None  Suicidal Thoughts:No data recorded Homicidal Thoughts:No data recorded  Sensorium  Memory: Immediate Fair; Recent Fair; Remote Fair  Judgment: Fair  Insight: Fair   Art Therapist  Concentration: Fair  Attention Span: Fair  Recall: [employer] of Knowledge: Fair  Language: Fair   Psychomotor Activity  Psychomotor Activity:No data recorded  Assets  Assets: Communication Skills; Desire for Improvement   Sleep  Sleep:No data recorded Estimated Sleeping Duration (Last 24 Hours): 6.75-8.50 hours  No data recorded  Physical Exam  Physical Exam ROS Blood pressure 134/83, pulse 72, temperature 98.2 F (36.8 C), temperature source Oral, resp. rate 18, SpO2 100%. There is no height or weight on file to calculate BMI.  Suicide Risk:  Minimal: No identifiable suicidal ideation.  Patients presenting with no risk factors but with morbid ruminations; may be classified as minimal risk based on the severity of the depressive symptoms  Plan Of Care/Follow-up recommendations:  Emergency Contact Plan: Patient understands to call Suicide Hotline at 84, or Mobile Crisis at [phone], call 911, or go to the nearest ER as appropriate in case of thoughts of harm to self or others,.  Disposition: home  Neil Appl, MD 12/19/2024, 12:55 PM

## 2024-12-19 NOTE — ED Notes (Signed)
 Pt is up and visible on the unit.  Pleasant and cooperative.  Good personal hygiene, showered and changed into clean scrubs. Denies SI/HI/AVH, depression.  Reports feeling anxious about his home situation. States that he has things that he has to deal with.  He states that he needs to find a job and his truck needs gas and oil and he doesn't have any money.  Reports that he wants to be discharged.

## 2025-01-07 ENCOUNTER — Encounter (HOSPITAL_COMMUNITY): Payer: Self-pay

## 2025-01-07 ENCOUNTER — Other Ambulatory Visit: Payer: Self-pay

## 2025-01-07 ENCOUNTER — Emergency Department (HOSPITAL_COMMUNITY): Payer: Self-pay

## 2025-01-07 ENCOUNTER — Emergency Department (HOSPITAL_COMMUNITY)
Admission: EM | Admit: 2025-01-07 | Discharge: 2025-01-08 | Disposition: A | Discharge status: Home / Self Care | Payer: Self-pay | Attending: Emergency Medicine | Admitting: Emergency Medicine

## 2025-01-07 DIAGNOSIS — Y92009 Unspecified place in unspecified non-institutional (private) residence as the place of occurrence of the external cause: Secondary | ICD-10-CM | POA: Insufficient documentation

## 2025-01-07 DIAGNOSIS — S62655B Nondisplaced fracture of medial phalanx of left ring finger, initial encounter for open fracture: Secondary | ICD-10-CM | POA: Insufficient documentation

## 2025-01-07 DIAGNOSIS — W230XXA Caught, crushed, jammed, or pinched between moving objects, initial encounter: Secondary | ICD-10-CM | POA: Insufficient documentation

## 2025-01-07 DIAGNOSIS — M7989 Other specified soft tissue disorders: Secondary | ICD-10-CM | POA: Insufficient documentation

## 2025-01-07 MED ORDER — OXYCODONE-ACETAMINOPHEN 5-325 MG PO TABS: 1.0000 | ORAL_TABLET | Freq: Once | ORAL | Status: DC

## 2025-01-07 MED ORDER — IBUPROFEN 800 MG PO TABS
800.0000 mg | ORAL_TABLET | Freq: Once | ORAL | Status: AC
  Administered 2025-01-07: 800 mg via ORAL
  Filled 2025-01-07: qty 1

## 2025-01-07 NOTE — ED Provider Triage Note (Signed)
 Emergency Medicine Provider Triage Evaluation Note  Anthony Leblanc , a 59 y.o. male  was evaluated in triage.  Pt complains of finger injury.  Patient reports that he closed his finger on a door at home.  States that he has pain to the left ring finger.  Denies any numbness or inability move the finger.  No medication taken prior to arriving.  Not on blood thinners.  Review of Systems  Positive: As above Negative: As above  Physical Exam  BP 111/82   Pulse 86   Temp 98.2 F (36.8 C) (Oral)   Resp 18   Ht 6' 2 (1.88 m)   Wt 106.6 kg   SpO2 98%   BMI 30.17 kg/m  Gen:   Awake, no distress   Resp:  Normal effort  MSK:   TTP in the left ring finger with difficulty moving fully due to pain.  Other:  Small laceration noted along the lateral portion of the left ring finger.  Medical Decision Making  Medically screening exam initiated at 10:04 PM.  Appropriate orders placed.  Anthony Leblanc was informed that the remainder of the evaluation will be completed by another provider, this initial triage assessment does not replace that evaluation, and the importance of remaining in the ED until their evaluation is complete.     Sincere Liuzzi A, PA-C 01/07/25 2206

## 2025-01-07 NOTE — ED Triage Notes (Addendum)
 Patient presents with injury to the 4th digit of the left hand. Patient states the finger got caught in the door at home. Area is bleeding and swollen. States he washed his hands and cleaned the area with alcohol. States his tetanus is within the last 6 months.

## 2025-01-07 NOTE — ED Notes (Signed)
 Wound was irrigated with normal saline. Nonadherent dressing was applied and finger was wrapped with guaze.

## 2025-01-08 MED ORDER — CEPHALEXIN 500 MG PO CAPS: 500.0000 mg | ORAL_CAPSULE | Freq: Three times a day (TID) | ORAL | 0 refills | Status: AC

## 2025-01-08 MED ORDER — LIDOCAINE HCL (PF) 1 % IJ SOLN
5.0000 mL | Freq: Once | INTRAMUSCULAR | Status: AC
  Administered 2025-01-08: 5 mL
  Filled 2025-01-08: qty 30

## 2025-01-08 MED ORDER — CEPHALEXIN 500 MG PO CAPS
500.0000 mg | ORAL_CAPSULE | Freq: Once | ORAL | Status: AC
  Administered 2025-01-08: 500 mg via ORAL
  Filled 2025-01-08: qty 1

## 2025-01-08 NOTE — Progress Notes (Signed)
 Orthopedic Tech Progress Note Patient Details:  Anthony Leblanc 01/01/1966 [id number]  Ortho Devices Type of Ortho Device: Ulna gutter splint, Cotton web roll, Ace wrap, Arm sling Ortho Device/Splint Location: L 4th Metacarpal Ortho Device/Splint Interventions: Ordered, Application, Adjustment   Post Interventions Patient Tolerated: Well Instructions Provided: Care of device  Early Ord L Nyja Westbrook 01/08/2025, 3:23 AM

## 2025-01-08 NOTE — Discharge Instructions (Signed)
 Keep finger/splint clean and dry. You can apply ice over the splint and elevate the hand to help with pain and swelling. Take Motrin  and Tylenol  as needed as directed.  Take antibiotics as prescribed. Follow up with hand orthopedics, call to schedule an appointment.

## 2025-01-08 NOTE — ED Notes (Signed)
 Patient's finger bandaged with bulky non-stick dressing.

## 2025-01-08 NOTE — ED Notes (Signed)
 Ortho tech called

## 2025-01-08 NOTE — ED Provider Notes (Signed)
 " [place] EMERGENCY DEPARTMENT AT [facility] Provider Note   CSN: [id number] Arrival date & time: 01/07/25  2137     Patient presents with: Finger Injury   Anthony Leblanc is a 59 y.o. male.   59 year old male presents with left fourth finger injury after closing the finger in a door tonight.  No other injuries.  Patient is ambidextrous.  Reports his last tetanus was within the past year.  Bleeding controlled.       Prior to Admission medications  Medication Sig Start Date End Date Taking? Authorizing Provider  cephALEXin  (KEFLEX ) 500 MG capsule Take 1 capsule (500 mg total) by mouth 3 (three) times daily for 7 days. 01/08/25 01/15/25 Yes Beverley Leita DELENA, PA-C  ezetimibe (ZETIA) 10 MG tablet Take 10 mg by mouth daily. 04/24/23   [provider]  meloxicam  (MOBIC ) 7.5 MG tablet Take 1 tablet (7.5 mg total) by mouth daily. 12/19/24   Paliy, Alisa, MD  Multiple Vitamin (MULTIVITAMIN WITH MINERALS) TABS tablet Take 1 tablet by mouth daily.    [provider]  Omega-3 Fatty Acids (FISH OIL) 1000 MG CPDR Take 1,000-3,000 mg by mouth daily.    [provider]  tadalafil (CIALIS) 20 MG tablet Take 20 mg by mouth daily as needed for erectile dysfunction.    [provider]    Allergies: Patient has no known allergies.    Review of Systems Negative except as per HPI Updated Vital Signs BP 131/82   Pulse 72   Temp 98 F (36.7 C)   Resp 15   Ht 6' 2 (1.88 m)   Wt 106.6 kg   SpO2 96%   BMI 30.17 kg/m   Physical Exam Vitals and nursing note reviewed.  Constitutional:      General: He is not in acute distress.    Appearance: He is well-developed. He is not diaphoretic.  HENT:     Head: Normocephalic and atraumatic.  Cardiovascular:     Pulses: Normal pulses.  Pulmonary:     Effort: Pulmonary effort is normal.  Musculoskeletal:        General: Swelling, tenderness and signs of injury present. No deformity.     Comments:  Laceration to the left 4th finger radial aspect across the PIP, no active bleeding, no sensory deficits, ROM limited by pain and swelling. TTP to the left 4th finger with swelling.   Skin:    General: Skin is warm and dry.     Findings: No erythema or rash.  Neurological:     Mental Status: He is alert and oriented to person, place, and time.     Sensory: No sensory deficit.  Psychiatric:        Behavior: Behavior normal.     (all labs ordered are listed, but only abnormal results are displayed) Labs Reviewed - No data to display  EKG: None  Radiology: DG Finger Ring Left Result Date: 01/07/2025 CLINICAL DATA:  Smashed finger in house store EXAM: LEFT RING FINGER 2+V COMPARISON:  None Available. FINDINGS: Acute diffusely comminuted fracture involving the fourth proximal phalanx, involves the head shaft and base with intra-articular extent of fracture at the fourth PIP joint. Mild fracture displacement. No subluxation. Soft tissue swelling without radiopaque foreign body IMPRESSION: Acute comminuted intra-articular fracture diffusely involving the fourth proximal phalanx with intra-articular involvement at the PIP joint Electronically Signed   By: Luke Bun M.D.   On: 01/07/2025 22:08     .Laceration Repair  Date/Time:  01/08/2025 1:54 AM  Performed by: Beverley Leita LABOR, PA-C Authorized by: Beverley Leita LABOR, PA-C   Consent:    Consent obtained:  Verbal   Consent given by:  Patient   Risks, benefits, and alternatives were discussed: yes     Risks discussed:  Infection, need for additional repair, pain and poor wound healing   Alternatives discussed:  No treatment Universal protocol:    Patient identity confirmed:  Verbally with patient Anesthesia:    Anesthesia method:  Nerve block   Block location:  4th MCP   Block needle gauge:  25 G   Block anesthetic:  Lidocaine  1% w/o epi   Block technique:  Digital block   Block injection procedure:  Anatomic landmarks identified,  anatomic landmarks palpated, introduced needle and incremental injection   Block outcome:  Anesthesia achieved Laceration details:    Location:  Finger   Finger location:  L ring finger   Length (cm):  2.5   Depth (mm):  3 Pre-procedure details:    Preparation:  Patient was prepped and draped in usual sterile fashion and imaging obtained to evaluate for foreign bodies Exploration:    Hemostasis achieved with:  Direct pressure   Imaging obtained: x-ray     Imaging outcome: foreign body not noted     Wound exploration: entire depth of wound visualized     Wound exploration comment:  ROM limited due to pain and swelling   Wound extent: underlying fracture     Wound extent: no foreign body     Contaminated: no   Treatment:    Area cleansed with:  Saline   Amount of cleaning:  Extensive   Irrigation solution:  Sterile saline Skin repair:    Repair method:  Sutures   Suture size:  4-0   Suture material:  Nylon   Suture technique:  Simple interrupted   Number of sutures:  3 Approximation:    Approximation:  Close Repair type:    Repair type:  Simple Post-procedure details:    Dressing:  Bulky dressing and splint for protection   Procedure completion:  Tolerated well, no immediate complications    Medications Ordered in the ED  ibuprofen  (ADVIL ) tablet 800 mg (800 mg Oral Given 01/07/25 2224)  lidocaine  (PF) (XYLOCAINE ) 1 % injection 5 mL (5 mLs Infiltration Given 01/08/25 0059)  cephALEXin  (KEFLEX ) capsule 500 mg (500 mg Oral Given 01/08/25 0219)                                    Medical Decision Making Amount and/or Complexity of Data Reviewed Radiology: ordered.  Risk Prescription drug management.   59 yo male with left 4th finger injury after closing the finger in a door tonight. Td UTD. Laceration to the 4th finger. Xr of the left hand with fracture of the 4th middle phalanx. Disagree with radiology interpretation- read as proximal phalanx fracture.  Wound was  extensively irrigated, digital block, closed with simple interrupted sutures. Plan is to place an ulnar gutter splint for protection and refer to hand Ortho for follow-up.  Will discharge on antibiotics.     Final diagnoses:  Open nondisplaced fracture of middle phalanx of left ring finger, initial encounter    ED Discharge Orders          Ordered    cephALEXin  (KEFLEX ) 500 MG capsule  3 times daily        01/08/25  0205               Beverley Leita LABOR, PA-C 01/08/25 0341    Griselda Norris, MD 01/08/25 0522  "

## 2025-01-11 NOTE — Progress Notes (Unsigned)
" ° °  Anthony Leblanc - 59 y.o. male MRN [id number]  Date of birth: 11-13-1966  Office Visit Note: Visit Date: 01/12/2025 PCP: Patient, No Pcp Per Referred by: No ref. provider found  Subjective: No chief complaint on file.  HPI: Anthony Leblanc is a pleasant 59 y.o. male who presents today for hand surgical evaluation of left fourth finger injury after closing the finger in a door Wednesday last week.. No other injuries. Patient is ambidextrous.  Was seen in the emergency department setting, underwent bedside irrigation and closure for a laceration over the injury site.  Imaging demonstrated a middle phalanx fracture with notable comminution, minimal displacement.  Patient was placed into a splint at that time.  He was discharged with antibiotics as well.  Pertinent ROS were reviewed with the patient and found to be negative unless otherwise specified above in HPI.   Visit Reason: Duration of symptoms: Hand dominance: {RIGHT/LEFT:20294} Occupation: Diabetic: {yes/no:20286} Smoking: {yes/no:20286} Heart/Lung History: Blood Thinners:   Prior Testing/EMG: Injections (Date): Treatments: Prior Surgery:    Assessment & Plan: Visit Diagnoses: No diagnosis found.  Plan: ***  Follow-up: No follow-ups on file.   Meds & Orders: No orders of the defined types were placed in this encounter.  No orders of the defined types were placed in this encounter.    Procedures: No procedures performed      Clinical History: No specialty comments available.  He reports that he has never smoked. He has never used smokeless tobacco.  Recent Labs    12/16/24 1902  HGBA1C 5.5    Objective:   Vital Signs: There were no vitals taken for this visit.  Physical Exam  Gen: Well-appearing, in no acute distress; non-toxic CV: Regular Rate. Well-perfused. Warm.  Resp: Breathing unlabored on room air; no wheezing. Psych: Fluid speech in conversation; appropriate affect; normal thought  process  Ortho Exam - ***   Imaging: No results found.  Past Medical/Family/Surgical/Social History: Medications & Allergies reviewed per EMR, new medications updated. Patient Active Problem List   Diagnosis Date Noted   Alcohol use disorder, severe, dependence (HCC) 12/17/2024   Past Medical History:  Diagnosis Date   Disturbance of skin sensation    Family history of diabetes mellitus    Hypersomnia, unspecified    Obesity, unspecified    Pain in joint, lower leg    Patella-femoral syndrome   Pure hypercholesterolemia    Family History  Problem Relation Age of Onset   Cancer Father    Diabetes Mother    Past Surgical History:  Procedure Laterality Date   HEMORRHOID SURGERY     Social History   Occupational History   Not on file  Tobacco Use   Smoking status: Never   Smokeless tobacco: Never  Vaping Use   Vaping status: Never Used  Substance and Sexual Activity   Alcohol use: Yes    Alcohol/week: 4.0 standard drinks of alcohol    Types: 4 Standard drinks or equivalent per week   Drug use: Not Currently    Types: Cocaine   Sexual activity: Not on file    Martia Dalby Estela) Arlinda, M.D. [facility], Hand Surgery  "

## 2025-01-12 ENCOUNTER — Encounter: Payer: Self-pay | Admitting: Orthopedic Surgery

## 2025-01-13 ENCOUNTER — Other Ambulatory Visit: Payer: Self-pay | Admitting: Orthopedic Surgery

## 2025-01-13 ENCOUNTER — Ambulatory Visit: Payer: Self-pay | Admitting: Orthopedic Surgery

## 2025-01-13 ENCOUNTER — Other Ambulatory Visit: Payer: Self-pay

## 2025-01-13 DIAGNOSIS — S62654B Nondisplaced fracture of medial phalanx of right ring finger, initial encounter for open fracture: Secondary | ICD-10-CM

## 2025-01-13 DIAGNOSIS — M79645 Pain in left finger(s): Secondary | ICD-10-CM

## 2025-01-13 NOTE — Progress Notes (Signed)
 "  Anthony Leblanc - 59 y.o. male MRN [id number]  Date of birth: 12-21-1966  Office Visit Note: Visit Date: 01/13/2025 PCP: Patient, No Pcp Per Referred by: No ref. provider found  Subjective: No chief complaint on file.  HPI: Anthony Leblanc is a pleasant 59 y.o. male who presents today for hand surgical evaluation of left fourth finger injury after closing the finger in a door Wednesday last week.. No other injuries. Patient is ambidextrous.  Was seen in the emergency department setting, underwent bedside irrigation and closure for a laceration over the injury site.  Imaging demonstrated a middle phalanx fracture with notable comminution, minimal displacement.  Patient was placed into a splint at that time.  He was discharged with antibiotics as well.  Pertinent ROS were reviewed with the patient and found to be negative unless otherwise specified above in HPI.   Visit Reason: left ring finger fracture Duration of symptoms: 1 week Hand dominance: right Occupation: unemployed Diabetic: No Smoking: Yes Heart/Lung History: none Blood Thinners: none  Prior Testing/EMG: xr Injections (Date): none Treatments: splint and abx (was unable to use) Prior Surgery: none    Assessment & Plan: Visit Diagnoses:  1. Nondisplaced fracture of middle phalanx of right ring finger, initial encounter for open fracture   2. Pain in left finger(s)     Plan: Extensive discussion was had the patient today regarding his left ring finger fracture.  Repeat x-rays were taken today which show a middle phalanx fracture with notable comminution and some intra-articular involvement at the PIP joint.  Fortunately, his fracture remains nondisplaced in nature.  There is no significant contamination at the laceration site which is along the radial border near the middle phalanx region.  He did undergo appropriate bedside irrigation debridement in the emergency department setting.  We did discuss the possibility of  contamination in this region which would require formalized irrigation and debridement and stabilization of the fracture.  However, there are no signs of infection on examination today and the fracture remains nondisplaced.  Patient would like to take the conservative approach which is reasonable.  I did emphasize the importance of the oral antibiotics which were prescribed to him, he has not picked these up yet but has agreed to do so.  I will plan on seeing him back in 1 week for repeat wound check and suture removal at that time.  Buddy taping of the ring to small finger was provided today.  Follow-up: No follow-ups on file.   Meds & Orders: No orders of the defined types were placed in this encounter.   Orders Placed This Encounter  Procedures   XR Finger Ring Left     Procedures: No procedures performed      Clinical History: No specialty comments available.  He reports that he has never smoked. He has never used smokeless tobacco.  Recent Labs    12/16/24 1902  HGBA1C 5.5    Objective:   Vital Signs: There were no vitals taken for this visit.  Physical Exam  Gen: Well-appearing, in no acute distress; non-toxic CV: Regular Rate. Well-perfused. Warm.  Resp: Breathing unlabored on room air; no wheezing. Psych: Fluid speech in conversation; appropriate affect; normal thought process  Ortho Exam Left ring finger with laceration over the radial border P2 region, sutures in place, skin edges well-approximated without erythema or drainage, digital range of motion is limited secondary to pain, notable swelling throughout the ring finger, normal color and capillary refill distally  Imaging: XR Finger Ring Left Result Date: 01/13/2025 X-rays of the left ring finger demonstrate previously known comminuted fracture of the middle phalanx, there is intra-articular involvement at the PIP region without significant joint step-off or displacement.   Past Medical/Family/Surgical/Social  History: Medications & Allergies reviewed per EMR, new medications updated. Patient Active Problem List   Diagnosis Date Noted   Alcohol use disorder, severe, dependence (HCC) 12/17/2024   Past Medical History:  Diagnosis Date   Disturbance of skin sensation    Family history of diabetes mellitus    Hypersomnia, unspecified    Obesity, unspecified    Pain in joint, lower leg    Patella-femoral syndrome   Pure hypercholesterolemia    Family History  Problem Relation Age of Onset   Cancer Father    Diabetes Mother    Past Surgical History:  Procedure Laterality Date   HEMORRHOID SURGERY     Social History   Occupational History   Not on file  Tobacco Use   Smoking status: Never   Smokeless tobacco: Never  Vaping Use   Vaping status: Never Used  Substance and Sexual Activity   Alcohol use: Yes    Alcohol/week: 4.0 standard drinks of alcohol    Types: 4 Standard drinks or equivalent per week   Drug use: Not Currently    Types: Cocaine   Sexual activity: Not on file    Deryl Ports Estela) Arlinda, M.D. [facility], Hand Surgery  "

## 2025-01-19 ENCOUNTER — Telehealth: Payer: Self-pay

## 2025-01-19 NOTE — Telephone Encounter (Signed)
 Sending to you as FYI Needs to be reschedule. Attempted to call and mychart message sent, Please follow up

## 2025-01-20 ENCOUNTER — Ambulatory Visit: Payer: Self-pay

## 2025-01-21 ENCOUNTER — Ambulatory Visit: Payer: Self-pay

## 2025-01-28 NOTE — Telephone Encounter (Signed)
LMOM to cb to schedule appt.
